# Patient Record
Sex: Female | Born: 1935 | Race: White | Hispanic: No | Marital: Married | Smoking: Never smoker
Health system: Southern US, Community
[De-identification: ages and names within clinical notes are randomized; demographics above are authoritative.]

## PROBLEM LIST (undated history)

## (undated) ENCOUNTER — Emergency Department (HOSPITAL_COMMUNITY): Admission: EM | Discharge status: Absent without leave | Payer: Medicare Other

## (undated) DIAGNOSIS — H3500 Unspecified background retinopathy: Secondary | ICD-10-CM

## (undated) DIAGNOSIS — I2510 Atherosclerotic heart disease of native coronary artery without angina pectoris: Secondary | ICD-10-CM

## (undated) DIAGNOSIS — K900 Celiac disease: Secondary | ICD-10-CM

## (undated) DIAGNOSIS — E785 Hyperlipidemia, unspecified: Secondary | ICD-10-CM

## (undated) DIAGNOSIS — F0390 Unspecified dementia without behavioral disturbance: Secondary | ICD-10-CM

## (undated) DIAGNOSIS — E079 Disorder of thyroid, unspecified: Secondary | ICD-10-CM

## (undated) DIAGNOSIS — M109 Gout, unspecified: Secondary | ICD-10-CM

## (undated) DIAGNOSIS — M1990 Unspecified osteoarthritis, unspecified site: Secondary | ICD-10-CM

## (undated) DIAGNOSIS — G629 Polyneuropathy, unspecified: Secondary | ICD-10-CM

## (undated) DIAGNOSIS — I10 Essential (primary) hypertension: Secondary | ICD-10-CM

---

## 1997-05-28 ENCOUNTER — Encounter (HOSPITAL_COMMUNITY): Admission: RE | Admit: 1997-05-28 | Discharge: 1997-08-26 | Payer: Self-pay | Admitting: *Deleted

## 1997-11-28 ENCOUNTER — Ambulatory Visit (HOSPITAL_COMMUNITY): Admission: RE | Admit: 1997-11-28 | Discharge: 1997-11-29 | Payer: Self-pay | Admitting: Cardiology

## 1998-01-10 ENCOUNTER — Ambulatory Visit (HOSPITAL_COMMUNITY): Admission: RE | Admit: 1998-01-10 | Discharge: 1998-01-11 | Payer: Self-pay | Admitting: Surgery

## 1999-01-09 ENCOUNTER — Encounter: Admission: RE | Admit: 1999-01-09 | Discharge: 1999-01-09 | Payer: Self-pay | Admitting: Family Medicine

## 1999-01-09 ENCOUNTER — Encounter: Payer: Self-pay | Admitting: Family Medicine

## 2000-05-18 ENCOUNTER — Other Ambulatory Visit: Admission: RE | Admit: 2000-05-18 | Discharge: 2000-05-18 | Payer: Self-pay | Admitting: Endocrinology

## 2000-08-09 ENCOUNTER — Other Ambulatory Visit: Admission: RE | Admit: 2000-08-09 | Discharge: 2000-08-09 | Payer: Self-pay | Admitting: Obstetrics & Gynecology

## 2000-08-10 ENCOUNTER — Encounter: Payer: Self-pay | Admitting: Student

## 2000-08-10 ENCOUNTER — Encounter: Admission: RE | Admit: 2000-08-10 | Discharge: 2000-08-10 | Payer: Self-pay | Admitting: Student

## 2001-09-07 ENCOUNTER — Encounter: Payer: Self-pay | Admitting: Orthopedic Surgery

## 2001-09-11 ENCOUNTER — Inpatient Hospital Stay (HOSPITAL_COMMUNITY): Admission: RE | Admit: 2001-09-11 | Discharge: 2001-09-18 | Payer: Self-pay | Admitting: Orthopedic Surgery

## 2001-09-13 ENCOUNTER — Encounter: Payer: Self-pay | Admitting: Orthopedic Surgery

## 2001-09-14 ENCOUNTER — Encounter: Payer: Self-pay | Admitting: Cardiology

## 2001-09-15 ENCOUNTER — Encounter: Payer: Self-pay | Admitting: Orthopedic Surgery

## 2001-09-15 ENCOUNTER — Encounter: Payer: Self-pay | Admitting: Interventional Cardiology

## 2001-09-18 ENCOUNTER — Inpatient Hospital Stay (HOSPITAL_COMMUNITY)
Admission: RE | Admit: 2001-09-18 | Discharge: 2001-09-26 | Payer: Self-pay | Admitting: Physical Medicine & Rehabilitation

## 2001-09-18 ENCOUNTER — Encounter: Payer: Self-pay | Admitting: Orthopedic Surgery

## 2001-10-24 ENCOUNTER — Encounter: Admission: RE | Admit: 2001-10-24 | Discharge: 2001-11-24 | Payer: Self-pay | Admitting: Orthopedic Surgery

## 2001-11-16 ENCOUNTER — Ambulatory Visit (HOSPITAL_COMMUNITY): Admission: RE | Admit: 2001-11-16 | Discharge: 2001-11-16 | Payer: Self-pay | Admitting: Orthopedic Surgery

## 2002-10-10 ENCOUNTER — Other Ambulatory Visit: Admission: RE | Admit: 2002-10-10 | Discharge: 2002-10-10 | Payer: Self-pay | Admitting: Obstetrics & Gynecology

## 2004-01-09 ENCOUNTER — Emergency Department (HOSPITAL_COMMUNITY): Admission: EM | Admit: 2004-01-09 | Discharge: 2004-01-09 | Payer: Self-pay | Admitting: Emergency Medicine

## 2004-01-23 ENCOUNTER — Encounter: Admission: RE | Admit: 2004-01-23 | Discharge: 2004-01-23 | Payer: Self-pay | Admitting: Orthopedic Surgery

## 2005-03-19 ENCOUNTER — Other Ambulatory Visit: Admission: RE | Admit: 2005-03-19 | Discharge: 2005-03-19 | Payer: Self-pay | Admitting: Obstetrics & Gynecology

## 2005-07-08 ENCOUNTER — Emergency Department (HOSPITAL_COMMUNITY): Admission: EM | Admit: 2005-07-08 | Discharge: 2005-07-09 | Payer: Self-pay | Admitting: Emergency Medicine

## 2005-07-14 ENCOUNTER — Ambulatory Visit (HOSPITAL_COMMUNITY): Admission: RE | Admit: 2005-07-14 | Discharge: 2005-07-14 | Payer: Self-pay | Admitting: Family Medicine

## 2005-08-06 ENCOUNTER — Inpatient Hospital Stay (HOSPITAL_COMMUNITY): Admission: EM | Admit: 2005-08-06 | Discharge: 2005-08-13 | Payer: Self-pay | Admitting: Internal Medicine

## 2005-08-13 ENCOUNTER — Inpatient Hospital Stay (HOSPITAL_COMMUNITY)
Admission: RE | Admit: 2005-08-13 | Discharge: 2005-08-21 | Payer: Self-pay | Admitting: Physical Medicine & Rehabilitation

## 2005-08-13 ENCOUNTER — Ambulatory Visit: Payer: Self-pay | Admitting: Physical Medicine & Rehabilitation

## 2005-08-30 ENCOUNTER — Emergency Department (HOSPITAL_COMMUNITY): Admission: EM | Admit: 2005-08-30 | Discharge: 2005-08-30 | Payer: Self-pay | Admitting: Family Medicine

## 2005-08-31 ENCOUNTER — Emergency Department (HOSPITAL_COMMUNITY): Admission: EM | Admit: 2005-08-31 | Discharge: 2005-08-31 | Payer: Self-pay | Admitting: Emergency Medicine

## 2005-12-09 ENCOUNTER — Ambulatory Visit (HOSPITAL_COMMUNITY): Admission: RE | Admit: 2005-12-09 | Discharge: 2005-12-10 | Payer: Self-pay | Admitting: Ophthalmology

## 2006-07-04 ENCOUNTER — Encounter: Admission: RE | Admit: 2006-07-04 | Discharge: 2006-07-04 | Payer: Self-pay | Admitting: Orthopaedic Surgery

## 2006-07-12 IMAGING — CR DG HIP COMPLETE 2+V*R*
3 series · 3 of 3 positions shown · non-contrast
Comparison: No prior studies.

CLINICAL DATA: Fall, right hip pain.
 RIGHT HIP ? 3 VIEW ? 08/31/05:

[t pelvis a.p.]
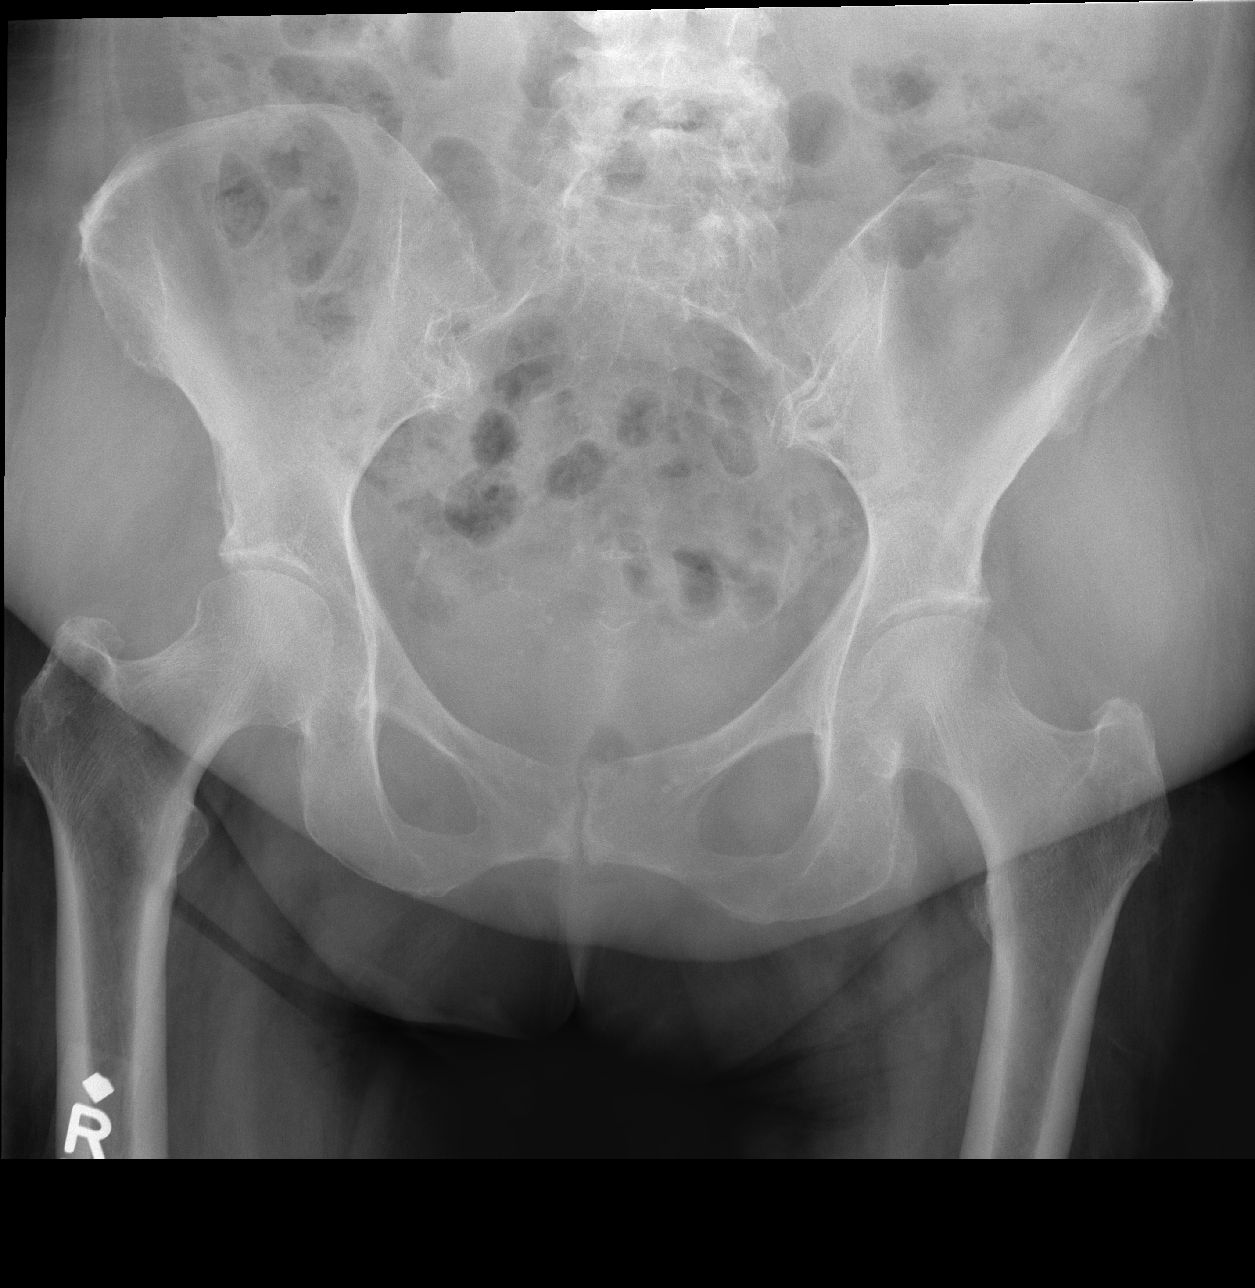

[t hip ap right]
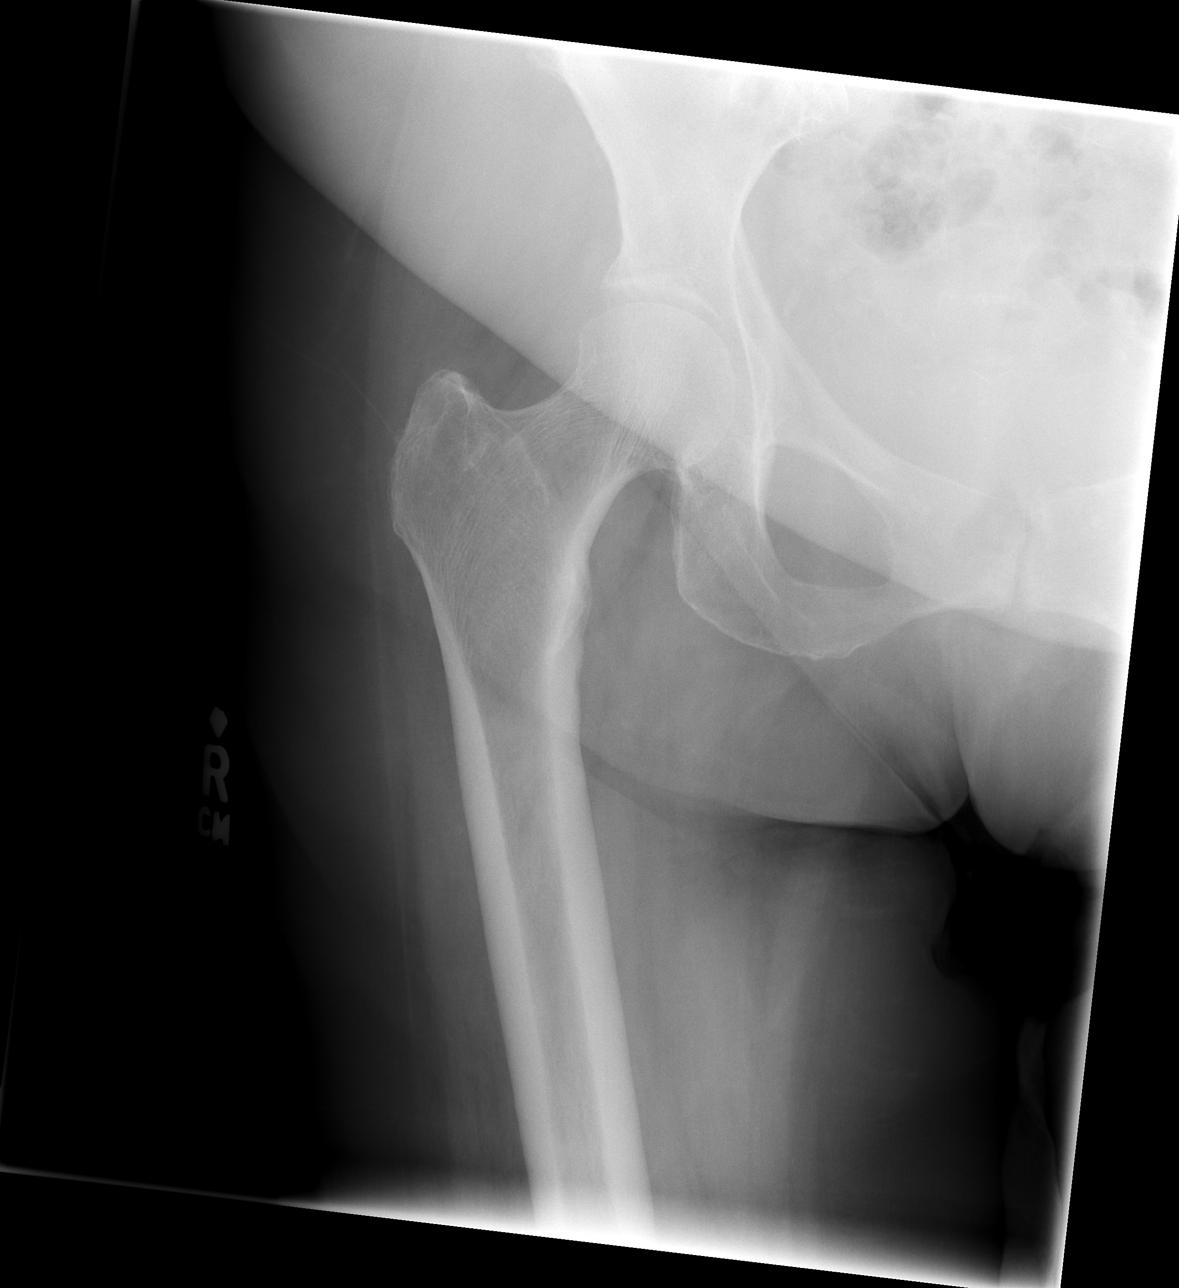

[t hip frog leg right]
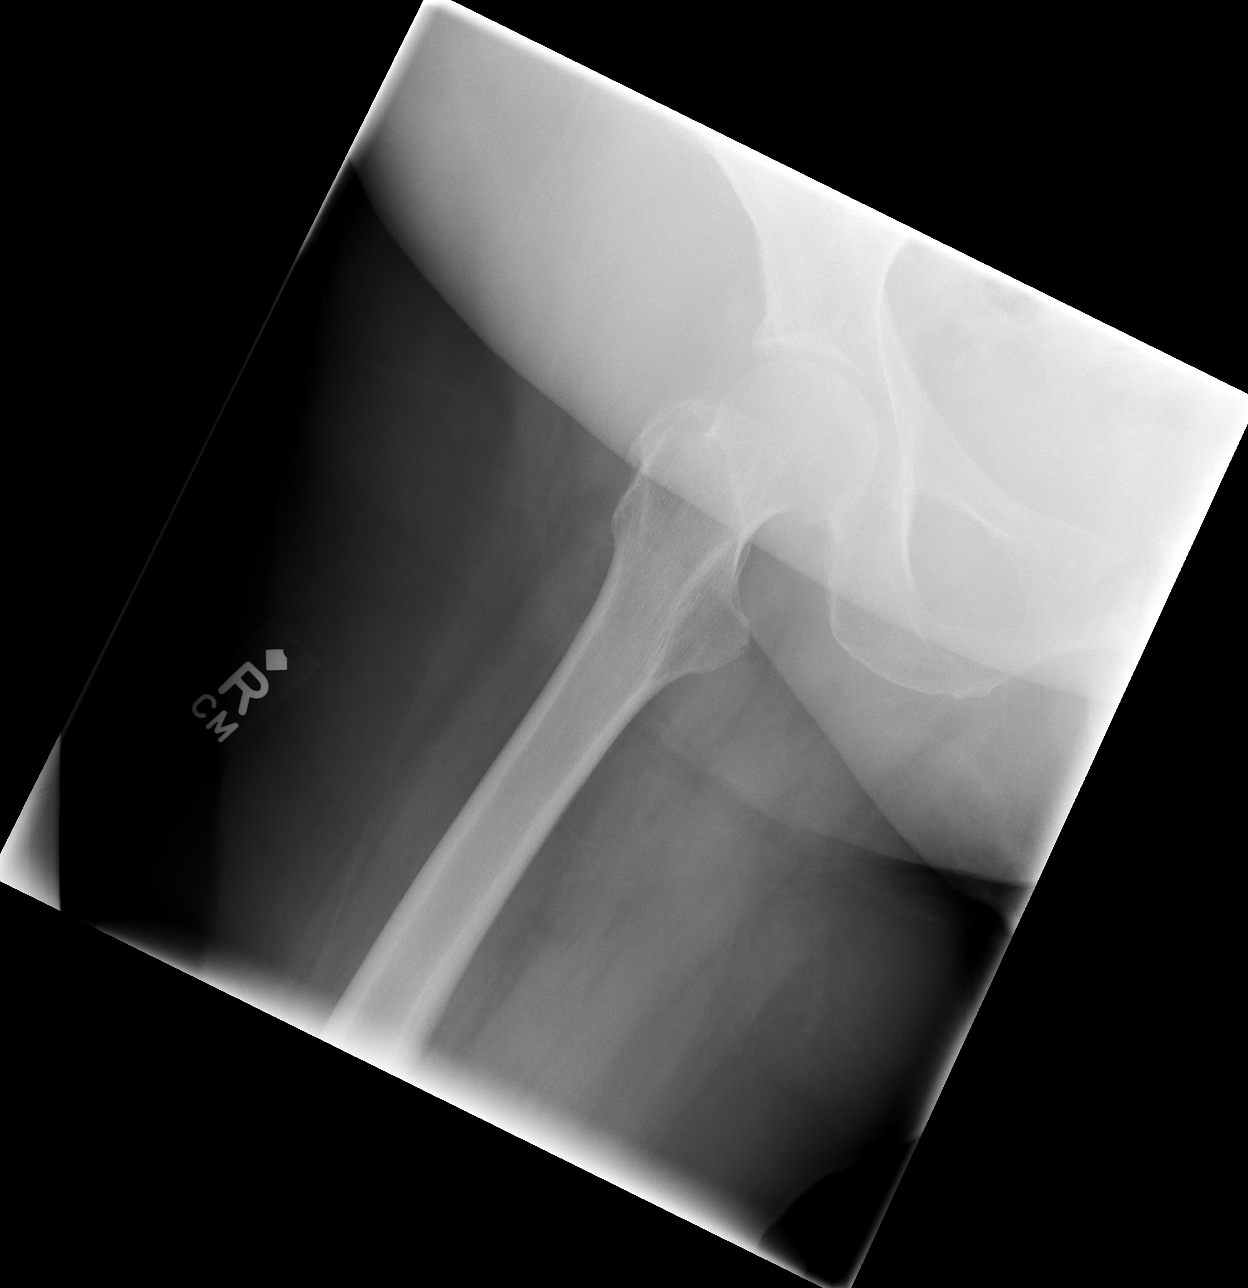

[3 of 3 positions shown; findings below may reference images not displayed]

FINDINGS: Lower lumbar spondylosis is present.  No fracture is radiographically apparent.  No dislocation noted.  The sacrum is obscured by bony demineralization and superimposed bowel gas.
IMPRESSION: 1.  No visible fracture.  If pain persists despite conservative therapy or if clinically warranted, CT could be helpful to exclude initially occult fracture. 
 2.  Lower lumbar spondylosis.

## 2006-07-12 IMAGING — CT CT L SPINE W/O CM
4 series · 17 of 36 positions shown, 19 images · IV contrast (agent unspecified)
Comparison: Comparison to MR performed on 07/14/05.

CLINICAL DATA: 75-year-old female with recent fall with back pain. 
LUMBAR SPINE CT WITHOUT CONTRAST:
TECHNIQUE: Multidetector CT imaging of the lumbar spine was performed.  Multiplanar CT image reconstructions were also generated.

[Series 2: l-spine helical · axial · 0.27mm/px · z∈[-304,-164]mm · 5 of 86 slices shown, 7 images]
[im 15/86  soft-tissue]
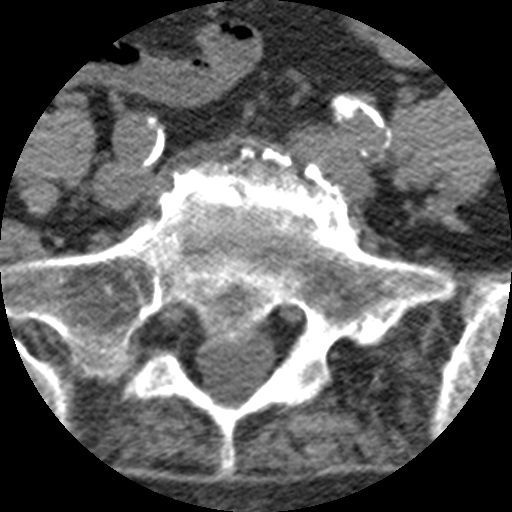
[im 15/86  bone]
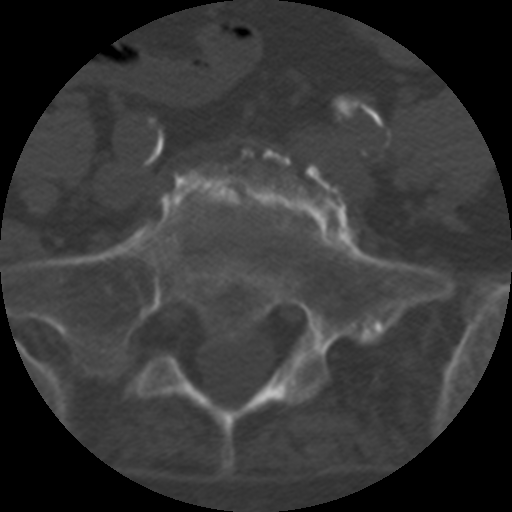
[im 29/86  bone]
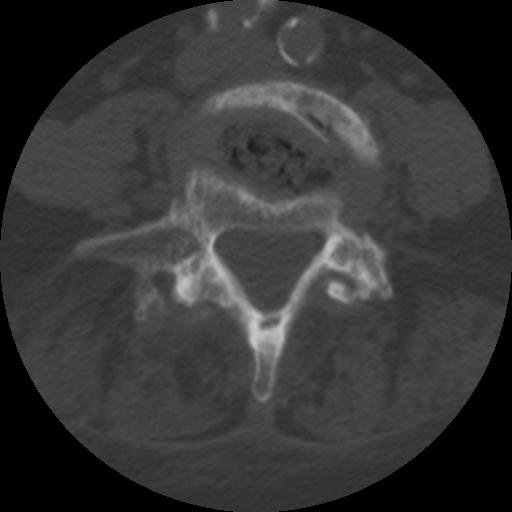
[im 43/86  bone]
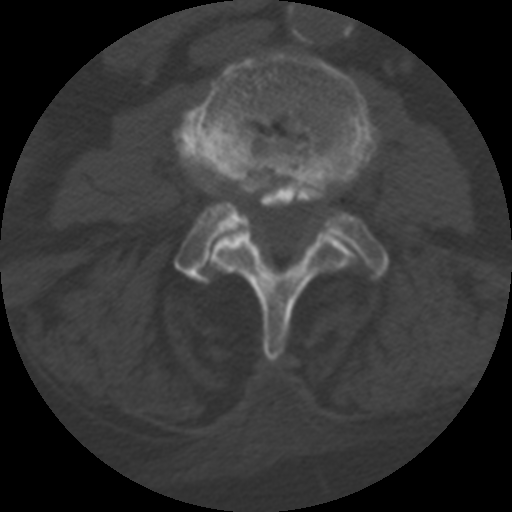
[im 57/86  bone]
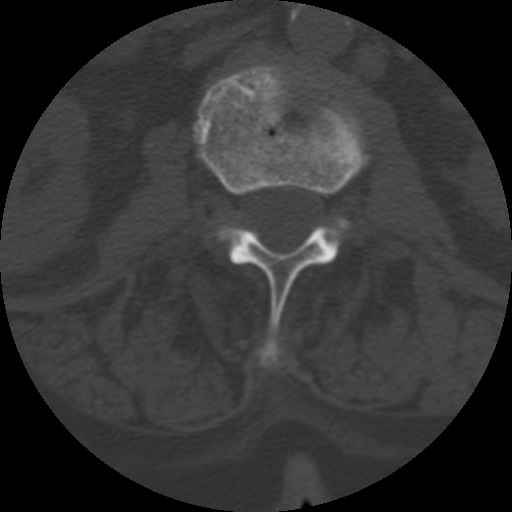
[im 71/86  soft-tissue]
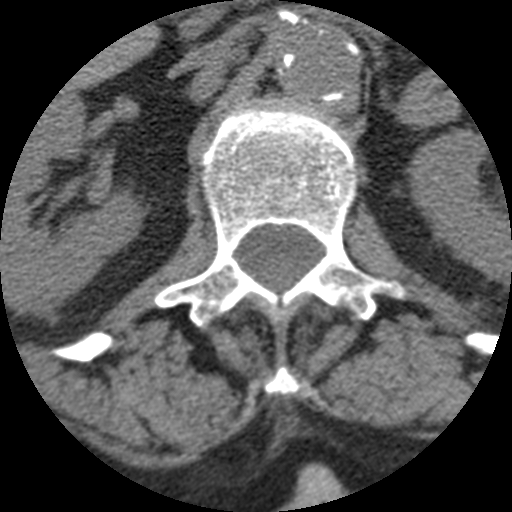
[im 71/86  bone]
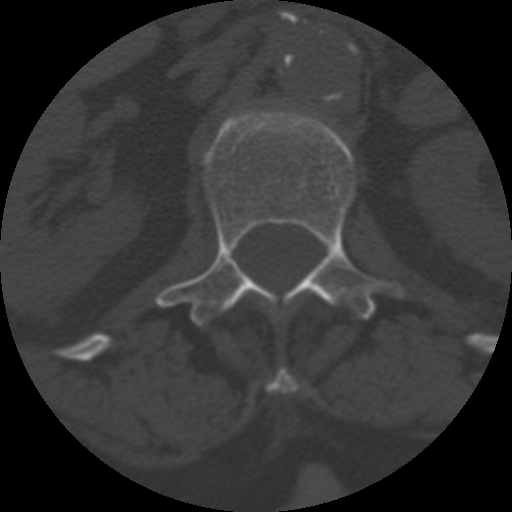

[Series 3: recon 2: l-spine helical · axial · 0.27mm/px · z∈[-304,-234]mm · 3 of 86 slices shown]
[im 15/86  bone]
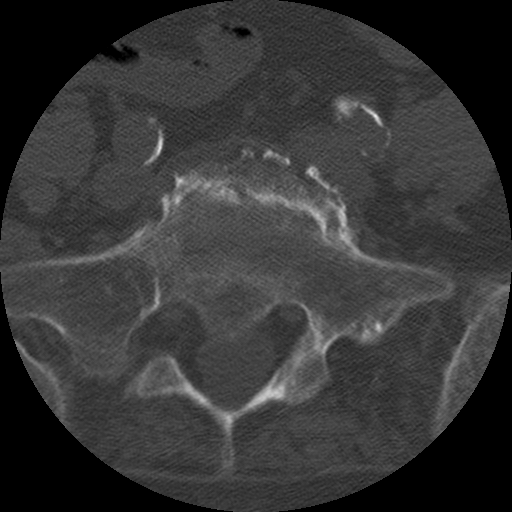
[im 29/86  bone]
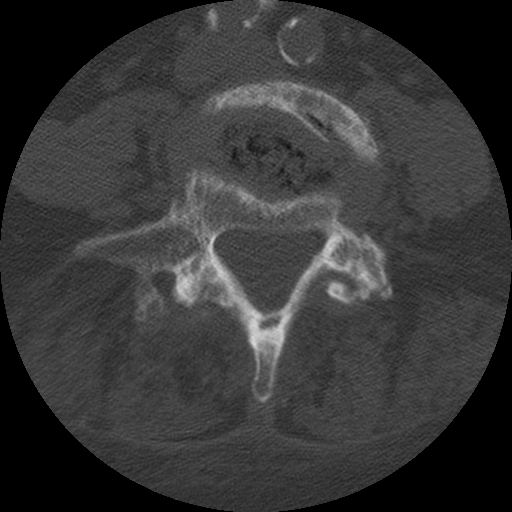
[im 43/86  bone]
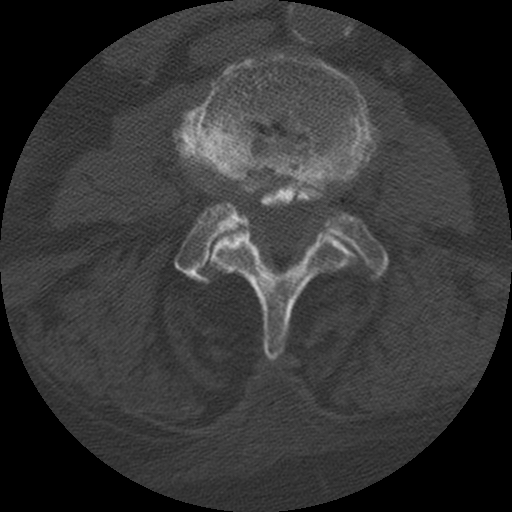

[Series 400: reformatted · sagittal · 0.43mm/px · 3 of 48 slices shown (1 of 2)]
[im 10/48  bone]
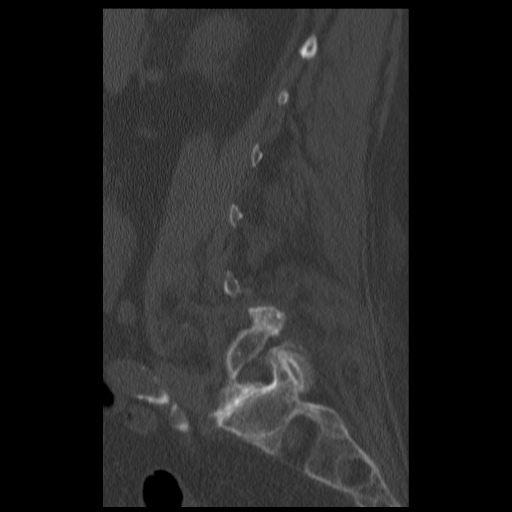
[im 19/48  bone]
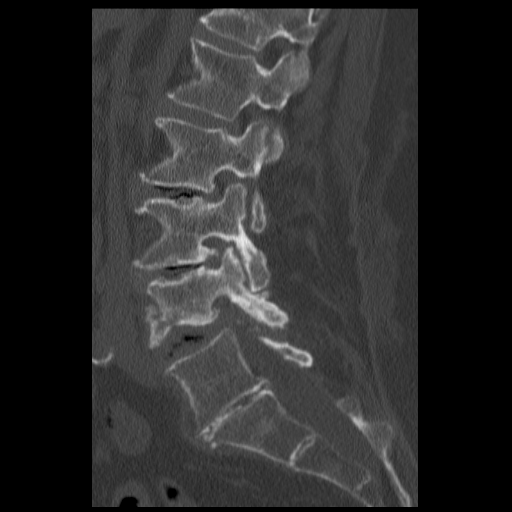
[im 29/48  bone]
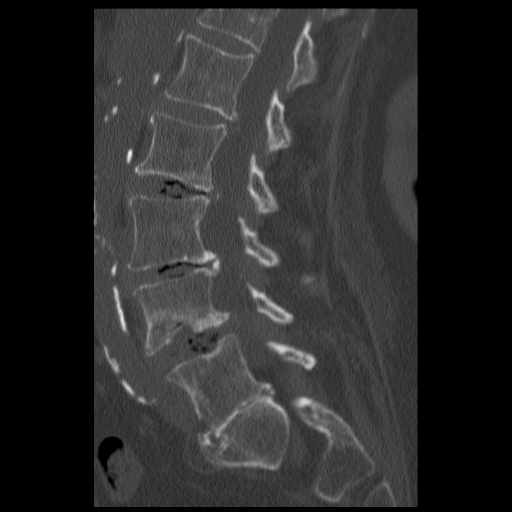

[Series 401: reformatted · coronal · 0.43mm/px · 6 of 55 slices shown (2 of 2)]
[im 19/55  bone]
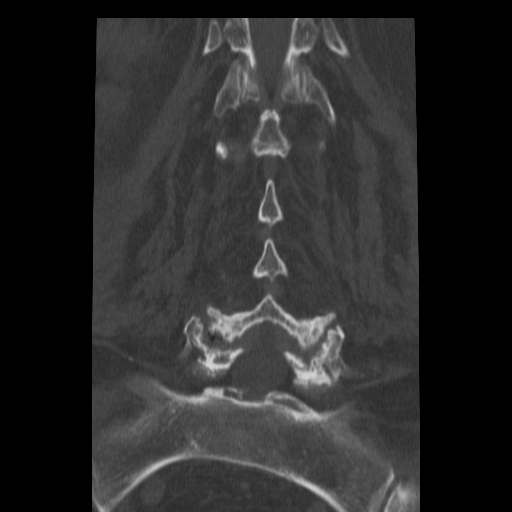
[im 23/55  bone]
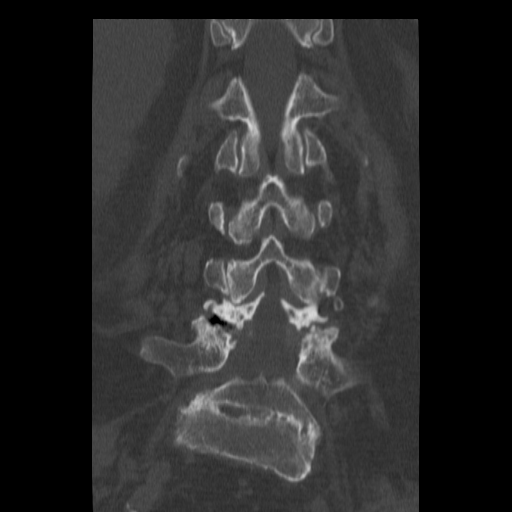
[im 27/55  soft-tissue]
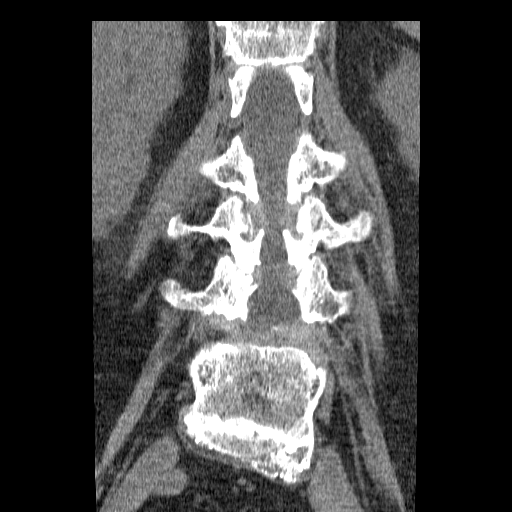
[im 28/55  bone]
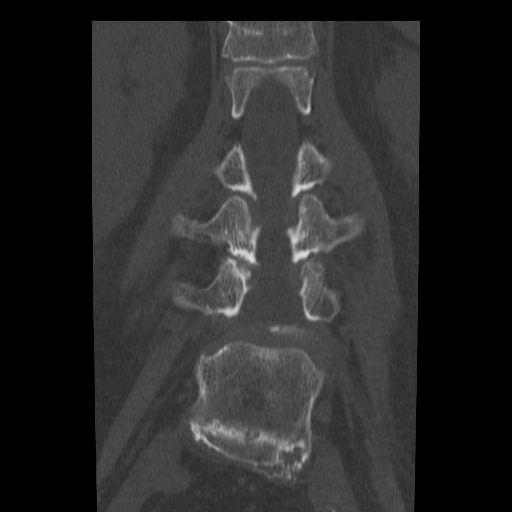
[im 32/55  bone]
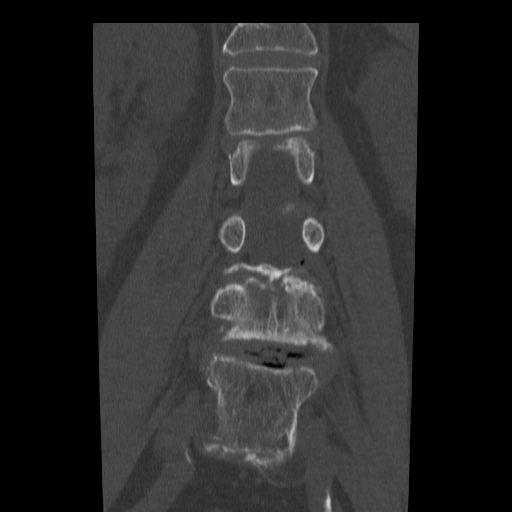
[im 37/55  bone]
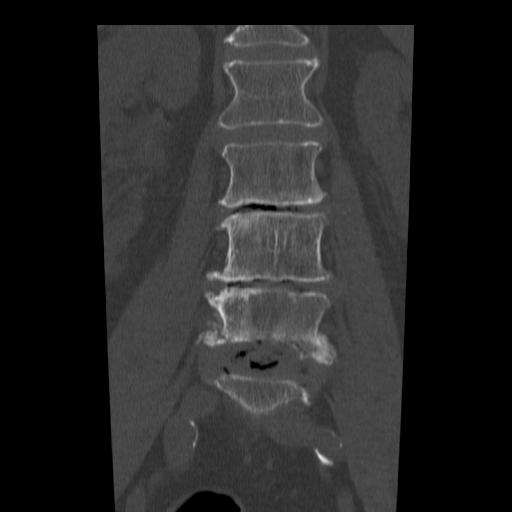

[17 of 36 positions shown; findings below may reference images not displayed]

FINDINGS: Same numbering system will be utilized as on the prior MRI. 
A compression fracture of the inferior endplate of L4 is unchanged.  Lumbar alignment is unremarkable.  Multilevel degenerative disc disease, spondylosis, and facet arthropathy are unchanged.  There is no evidence of acute fracture, spondylolisthesis, or dislocation identified.  
L1-2:  Diffuse disc bulge slightly eccentric in the left paracentral region indents the thecal sac.
L2-3:  Diffuse disc bulge, facet arthropathy, and ligamentum flavum hypertrophy contribute to mild biforaminal narrowing, unchanged. 
L3-4:  Diffuse disc protrusion with associated osteophytes eccentric in the right paracentral foraminal region contributes to moderate to severe right foraminal narrowing and mild to moderate central spinal and left foraminal narrowing. 
L4-5:  Diffuse disc bulge, advanced facet arthropathy, and ligamentum flavum hypertrophy contribute to moderate biforaminal, lateral recess, and neuroforaminal narrowing.
L5-S1:  Stable.
IMPRESSION: 1.  No acute abnormality.  
2.  Stable compression fracture of the inferior endplate of L4. 
3.  Stable multilevel degenerative disc disease, spondylosis, and facet arthropathy contributing to central spinal, neuroforaminal, and lateral recess narrowing as described.

## 2006-07-27 ENCOUNTER — Encounter: Admission: RE | Admit: 2006-07-27 | Discharge: 2006-07-27 | Payer: Self-pay | Admitting: Orthopaedic Surgery

## 2006-09-15 ENCOUNTER — Encounter
Admission: RE | Admit: 2006-09-15 | Discharge: 2006-12-14 | Payer: Self-pay | Admitting: Physical Medicine & Rehabilitation

## 2006-09-16 ENCOUNTER — Ambulatory Visit: Payer: Self-pay | Admitting: Physical Medicine & Rehabilitation

## 2006-11-14 ENCOUNTER — Ambulatory Visit: Payer: Self-pay | Admitting: Physical Medicine & Rehabilitation

## 2006-12-28 ENCOUNTER — Encounter
Admission: RE | Admit: 2006-12-28 | Discharge: 2007-01-28 | Payer: Self-pay | Admitting: Physical Medicine & Rehabilitation

## 2006-12-28 ENCOUNTER — Ambulatory Visit: Payer: Self-pay | Admitting: Physical Medicine & Rehabilitation

## 2007-01-06 ENCOUNTER — Encounter: Admission: RE | Admit: 2007-01-06 | Discharge: 2007-01-06 | Payer: Self-pay | Admitting: Internal Medicine

## 2007-03-08 ENCOUNTER — Ambulatory Visit: Payer: Self-pay | Admitting: Physical Medicine & Rehabilitation

## 2007-03-08 ENCOUNTER — Encounter
Admission: RE | Admit: 2007-03-08 | Discharge: 2007-06-06 | Payer: Self-pay | Admitting: Physical Medicine & Rehabilitation

## 2007-04-13 ENCOUNTER — Ambulatory Visit: Payer: Self-pay | Admitting: Physical Medicine & Rehabilitation

## 2007-05-17 ENCOUNTER — Ambulatory Visit: Payer: Self-pay | Admitting: Physical Medicine & Rehabilitation

## 2007-07-07 ENCOUNTER — Encounter
Admission: RE | Admit: 2007-07-07 | Discharge: 2007-10-05 | Payer: Self-pay | Admitting: Physical Medicine & Rehabilitation

## 2007-07-11 ENCOUNTER — Ambulatory Visit: Payer: Self-pay | Admitting: Physical Medicine & Rehabilitation

## 2007-08-10 ENCOUNTER — Ambulatory Visit: Payer: Self-pay | Admitting: Physical Medicine & Rehabilitation

## 2007-09-07 ENCOUNTER — Ambulatory Visit: Payer: Self-pay | Admitting: Physical Medicine & Rehabilitation

## 2007-10-05 ENCOUNTER — Encounter
Admission: RE | Admit: 2007-10-05 | Discharge: 2008-01-03 | Payer: Self-pay | Admitting: Physical Medicine & Rehabilitation

## 2007-10-09 ENCOUNTER — Ambulatory Visit: Payer: Self-pay | Admitting: Physical Medicine & Rehabilitation

## 2007-10-23 ENCOUNTER — Ambulatory Visit: Payer: Self-pay | Admitting: Physical Medicine & Rehabilitation

## 2007-11-17 ENCOUNTER — Encounter
Admission: RE | Admit: 2007-11-17 | Discharge: 2007-11-17 | Payer: Self-pay | Admitting: Physical Medicine & Rehabilitation

## 2007-11-20 ENCOUNTER — Ambulatory Visit: Payer: Self-pay | Admitting: Physical Medicine & Rehabilitation

## 2007-12-19 ENCOUNTER — Ambulatory Visit: Payer: Self-pay | Admitting: Physical Medicine & Rehabilitation

## 2008-01-15 ENCOUNTER — Encounter
Admission: RE | Admit: 2008-01-15 | Discharge: 2008-04-14 | Payer: Self-pay | Admitting: Physical Medicine & Rehabilitation

## 2008-01-16 ENCOUNTER — Ambulatory Visit: Payer: Self-pay | Admitting: Physical Medicine & Rehabilitation

## 2008-02-20 ENCOUNTER — Ambulatory Visit: Payer: Self-pay | Admitting: Physical Medicine & Rehabilitation

## 2008-03-21 ENCOUNTER — Ambulatory Visit: Payer: Self-pay | Admitting: Physical Medicine & Rehabilitation

## 2008-04-11 ENCOUNTER — Encounter
Admission: RE | Admit: 2008-04-11 | Discharge: 2008-07-10 | Payer: Self-pay | Admitting: Physical Medicine & Rehabilitation

## 2008-04-15 ENCOUNTER — Ambulatory Visit: Payer: Self-pay | Admitting: Physical Medicine & Rehabilitation

## 2008-05-16 ENCOUNTER — Ambulatory Visit: Payer: Self-pay | Admitting: Physical Medicine & Rehabilitation

## 2008-06-17 ENCOUNTER — Ambulatory Visit: Payer: Self-pay | Admitting: Physical Medicine & Rehabilitation

## 2008-07-12 ENCOUNTER — Encounter
Admission: RE | Admit: 2008-07-12 | Discharge: 2008-10-10 | Payer: Self-pay | Admitting: Physical Medicine & Rehabilitation

## 2008-07-16 ENCOUNTER — Ambulatory Visit: Payer: Self-pay | Admitting: Physical Medicine & Rehabilitation

## 2008-08-22 ENCOUNTER — Ambulatory Visit: Payer: Self-pay | Admitting: Physical Medicine & Rehabilitation

## 2008-08-28 ENCOUNTER — Encounter: Admission: RE | Admit: 2008-08-28 | Discharge: 2008-08-28 | Payer: Self-pay | Admitting: Obstetrics & Gynecology

## 2008-11-05 ENCOUNTER — Encounter
Admission: RE | Admit: 2008-11-05 | Discharge: 2008-11-07 | Payer: Self-pay | Admitting: Physical Medicine & Rehabilitation

## 2008-11-07 ENCOUNTER — Ambulatory Visit: Payer: Self-pay | Admitting: Physical Medicine & Rehabilitation

## 2009-02-24 ENCOUNTER — Encounter
Admission: RE | Admit: 2009-02-24 | Discharge: 2009-05-25 | Payer: Self-pay | Admitting: Physical Medicine & Rehabilitation

## 2009-02-24 ENCOUNTER — Ambulatory Visit: Payer: Self-pay | Admitting: Physical Medicine & Rehabilitation

## 2009-03-24 ENCOUNTER — Ambulatory Visit: Payer: Self-pay | Admitting: Physical Medicine & Rehabilitation

## 2009-04-22 ENCOUNTER — Ambulatory Visit: Payer: Self-pay | Admitting: Physical Medicine & Rehabilitation

## 2009-07-03 ENCOUNTER — Ambulatory Visit: Payer: Self-pay | Admitting: Physical Medicine & Rehabilitation

## 2009-07-03 ENCOUNTER — Encounter
Admission: RE | Admit: 2009-07-03 | Discharge: 2009-10-01 | Payer: Self-pay | Admitting: Physical Medicine & Rehabilitation

## 2009-07-25 ENCOUNTER — Ambulatory Visit: Payer: Self-pay | Admitting: Physical Medicine & Rehabilitation

## 2009-08-27 ENCOUNTER — Ambulatory Visit: Payer: Self-pay | Admitting: Physical Medicine & Rehabilitation

## 2009-09-17 ENCOUNTER — Encounter
Admission: RE | Admit: 2009-09-17 | Discharge: 2009-09-17 | Payer: Self-pay | Admitting: Physical Medicine & Rehabilitation

## 2009-09-24 ENCOUNTER — Ambulatory Visit: Payer: Self-pay | Admitting: Physical Medicine & Rehabilitation

## 2009-10-24 ENCOUNTER — Encounter
Admission: RE | Admit: 2009-10-24 | Discharge: 2010-01-22 | Payer: Self-pay | Source: Home / Self Care | Admitting: Physical Medicine & Rehabilitation

## 2009-10-30 ENCOUNTER — Ambulatory Visit: Payer: Self-pay | Admitting: Physical Medicine & Rehabilitation

## 2009-12-03 ENCOUNTER — Ambulatory Visit: Payer: Self-pay | Admitting: Physical Medicine & Rehabilitation

## 2010-01-09 ENCOUNTER — Ambulatory Visit: Payer: Self-pay | Admitting: Physical Medicine & Rehabilitation

## 2010-01-30 ENCOUNTER — Encounter
Admission: RE | Admit: 2010-01-30 | Discharge: 2010-02-04 | Payer: Self-pay | Source: Home / Self Care | Attending: Physical Medicine & Rehabilitation | Admitting: Physical Medicine & Rehabilitation

## 2010-02-04 ENCOUNTER — Ambulatory Visit: Payer: Self-pay | Admitting: Physical Medicine & Rehabilitation

## 2010-02-27 ENCOUNTER — Encounter
Admission: RE | Admit: 2010-02-27 | Discharge: 2010-03-24 | Payer: Self-pay | Source: Home / Self Care | Attending: Physical Medicine & Rehabilitation | Admitting: Physical Medicine & Rehabilitation

## 2010-03-04 ENCOUNTER — Ambulatory Visit
Admission: RE | Admit: 2010-03-04 | Discharge: 2010-03-04 | Payer: Self-pay | Source: Home / Self Care | Attending: Physical Medicine & Rehabilitation | Admitting: Physical Medicine & Rehabilitation

## 2010-03-16 ENCOUNTER — Encounter: Payer: Self-pay | Admitting: Obstetrics & Gynecology

## 2010-04-03 ENCOUNTER — Ambulatory Visit: Payer: Self-pay | Admitting: Physical Medicine & Rehabilitation

## 2010-04-14 ENCOUNTER — Ambulatory Visit: Payer: PRIVATE HEALTH INSURANCE | Admitting: Physical Medicine & Rehabilitation

## 2010-04-14 ENCOUNTER — Encounter: Payer: Medicare Other | Attending: Physical Medicine & Rehabilitation

## 2010-04-14 DIAGNOSIS — R5381 Other malaise: Secondary | ICD-10-CM | POA: Insufficient documentation

## 2010-04-14 DIAGNOSIS — M25529 Pain in unspecified elbow: Secondary | ICD-10-CM

## 2010-04-14 DIAGNOSIS — F3289 Other specified depressive episodes: Secondary | ICD-10-CM | POA: Insufficient documentation

## 2010-04-14 DIAGNOSIS — S5290XD Unspecified fracture of unspecified forearm, subsequent encounter for closed fracture with routine healing: Secondary | ICD-10-CM | POA: Insufficient documentation

## 2010-04-14 DIAGNOSIS — F411 Generalized anxiety disorder: Secondary | ICD-10-CM | POA: Insufficient documentation

## 2010-04-14 DIAGNOSIS — G8929 Other chronic pain: Secondary | ICD-10-CM | POA: Insufficient documentation

## 2010-04-14 DIAGNOSIS — R262 Difficulty in walking, not elsewhere classified: Secondary | ICD-10-CM | POA: Insufficient documentation

## 2010-04-14 DIAGNOSIS — M48061 Spinal stenosis, lumbar region without neurogenic claudication: Secondary | ICD-10-CM

## 2010-04-14 DIAGNOSIS — M47817 Spondylosis without myelopathy or radiculopathy, lumbosacral region: Secondary | ICD-10-CM | POA: Insufficient documentation

## 2010-04-14 DIAGNOSIS — F329 Major depressive disorder, single episode, unspecified: Secondary | ICD-10-CM | POA: Insufficient documentation

## 2010-04-14 DIAGNOSIS — M719 Bursopathy, unspecified: Secondary | ICD-10-CM | POA: Insufficient documentation

## 2010-04-14 DIAGNOSIS — M67919 Unspecified disorder of synovium and tendon, unspecified shoulder: Secondary | ICD-10-CM | POA: Insufficient documentation

## 2010-04-14 DIAGNOSIS — R42 Dizziness and giddiness: Secondary | ICD-10-CM | POA: Insufficient documentation

## 2010-05-15 NOTE — Assessment & Plan Note (Signed)
This is a 77 year old female with chief complaint of shoulder pain as well as wrist pain and secondary complaint of her chronic back pain.  INTERVAL HISTORY:  Since I last saw the patient on January 09, 2010, she has had a fall.  She tripped over husband's oxygen tubing and sustained a right distal radial fracture which is in a splint but is to be casted tomorrow.  She has had some increased right shoulder pain, is in a sling at the current time.  Her back pain is about the same.  Her average pain is 7-8/10, intermittent, aching, sharp, interfering with activity.  She now needs home health aid, her husband helps her as well with her dressing and bathing and toileting, meal prep.  She hurt her knee but had negative x-rays.  REVIEW OF SYSTEMS:  Weakness, trouble walking, dizziness, depression, anxiety.  CURRENT PAIN MEDICATIONS:  Oxycodone 7.5/325, she gets 100 tablets per month, to take three times a day or four times a day, more recently has been up to four times a day.  PHYSICAL EXAMINATION:  VITAL SIGNS:  Blood pressure 157/43, pulse 61, respirations 18, O2 sat 98% on room air. GENERAL:  In no acute distress. HEENT:  She does have some bruising around her right eye which is fading.  CT of the skull, I reviewed, no evidence of fracture. EXTREMITIES:  She has no numbness in her right hand.  She has pain with shoulder abduction.  I reviewed her x-ray of her right wrist which did show the distal radial fracture.  IMPRESSION: 1. Lumbar spondylosis and stenosis, this is her underlying pain     complaint. 2. Acute right radius fracture, being treated at [facility], Dr. Farris Has. 3. Right shoulder pain, subacromial bursitis, question whether she has     a tear related to her fall.  In any case, I do not think it is     appropriate to inject at this point.  I will see her back in     another month, possibly inject it at that time.  Continue current     medications  except increase to 120 tablets per month of the     oxycodone.     Erick Colace, M.D. Electronically Signed    AEK/MedQ D:  04/14/2010 17:23:03  T:  04/14/2010 21:49:13  Job #:  [id number]  cc:   Estell Harpin, M.D. Fax: [phone]

## 2010-05-17 ENCOUNTER — Emergency Department (HOSPITAL_COMMUNITY): Payer: Medicare Other

## 2010-05-17 ENCOUNTER — Observation Stay (HOSPITAL_COMMUNITY)
Admission: EM | Admit: 2010-05-17 | Discharge: 2010-05-20 | Disposition: A | Discharge status: Skilled Nursing Facility | Payer: Medicare Other | Attending: Internal Medicine | Admitting: Internal Medicine

## 2010-05-17 DIAGNOSIS — N184 Chronic kidney disease, stage 4 (severe): Secondary | ICD-10-CM | POA: Insufficient documentation

## 2010-05-17 DIAGNOSIS — E1149 Type 2 diabetes mellitus with other diabetic neurological complication: Secondary | ICD-10-CM | POA: Insufficient documentation

## 2010-05-17 DIAGNOSIS — M549 Dorsalgia, unspecified: Secondary | ICD-10-CM | POA: Insufficient documentation

## 2010-05-17 DIAGNOSIS — E1129 Type 2 diabetes mellitus with other diabetic kidney complication: Secondary | ICD-10-CM | POA: Insufficient documentation

## 2010-05-17 DIAGNOSIS — M109 Gout, unspecified: Secondary | ICD-10-CM | POA: Insufficient documentation

## 2010-05-17 DIAGNOSIS — S62109A Fracture of unspecified carpal bone, unspecified wrist, initial encounter for closed fracture: Principal | ICD-10-CM | POA: Insufficient documentation

## 2010-05-17 DIAGNOSIS — S52509A Unspecified fracture of the lower end of unspecified radius, initial encounter for closed fracture: Secondary | ICD-10-CM | POA: Insufficient documentation

## 2010-05-17 DIAGNOSIS — E785 Hyperlipidemia, unspecified: Secondary | ICD-10-CM | POA: Insufficient documentation

## 2010-05-17 DIAGNOSIS — G8929 Other chronic pain: Secondary | ICD-10-CM | POA: Insufficient documentation

## 2010-05-17 DIAGNOSIS — A498 Other bacterial infections of unspecified site: Secondary | ICD-10-CM | POA: Insufficient documentation

## 2010-05-17 DIAGNOSIS — E039 Hypothyroidism, unspecified: Secondary | ICD-10-CM | POA: Insufficient documentation

## 2010-05-17 DIAGNOSIS — I10 Essential (primary) hypertension: Secondary | ICD-10-CM | POA: Insufficient documentation

## 2010-05-17 DIAGNOSIS — Y92009 Unspecified place in unspecified non-institutional (private) residence as the place of occurrence of the external cause: Secondary | ICD-10-CM | POA: Insufficient documentation

## 2010-05-17 DIAGNOSIS — Z79899 Other long term (current) drug therapy: Secondary | ICD-10-CM | POA: Insufficient documentation

## 2010-05-17 DIAGNOSIS — W010XXA Fall on same level from slipping, tripping and stumbling without subsequent striking against object, initial encounter: Secondary | ICD-10-CM | POA: Insufficient documentation

## 2010-05-17 DIAGNOSIS — M810 Age-related osteoporosis without current pathological fracture: Secondary | ICD-10-CM | POA: Insufficient documentation

## 2010-05-17 DIAGNOSIS — N390 Urinary tract infection, site not specified: Secondary | ICD-10-CM | POA: Insufficient documentation

## 2010-05-17 DIAGNOSIS — I129 Hypertensive chronic kidney disease with stage 1 through stage 4 chronic kidney disease, or unspecified chronic kidney disease: Secondary | ICD-10-CM | POA: Insufficient documentation

## 2010-05-17 DIAGNOSIS — R627 Adult failure to thrive: Secondary | ICD-10-CM | POA: Insufficient documentation

## 2010-05-17 DIAGNOSIS — E1139 Type 2 diabetes mellitus with other diabetic ophthalmic complication: Secondary | ICD-10-CM | POA: Insufficient documentation

## 2010-05-17 DIAGNOSIS — G4733 Obstructive sleep apnea (adult) (pediatric): Secondary | ICD-10-CM | POA: Insufficient documentation

## 2010-05-17 DIAGNOSIS — F329 Major depressive disorder, single episode, unspecified: Secondary | ICD-10-CM | POA: Insufficient documentation

## 2010-05-17 DIAGNOSIS — F3289 Other specified depressive episodes: Secondary | ICD-10-CM | POA: Insufficient documentation

## 2010-05-17 DIAGNOSIS — S52609A Unspecified fracture of lower end of unspecified ulna, initial encounter for closed fracture: Secondary | ICD-10-CM | POA: Insufficient documentation

## 2010-05-17 LAB — URINALYSIS, ROUTINE W REFLEX MICROSCOPIC
Bilirubin Urine: NEGATIVE
Hgb urine dipstick: NEGATIVE
Ketones, ur: NEGATIVE mg/dL
Nitrite: POSITIVE — AB
pH: 7 (ref 5.0–8.0)

## 2010-05-17 LAB — URINE MICROSCOPIC-ADD ON

## 2010-05-17 LAB — GLUCOSE, CAPILLARY: Glucose-Capillary: 189 mg/dL — ABNORMAL HIGH (ref 70–99)

## 2010-05-18 LAB — CBC
HCT: 28.3 % — ABNORMAL LOW (ref 36.0–46.0)
Platelets: 206 10*3/uL (ref 150–400)
RDW: 14.5 % (ref 11.5–15.5)
WBC: 5.1 10*3/uL (ref 4.0–10.5)

## 2010-05-18 LAB — COMPREHENSIVE METABOLIC PANEL
ALT: 23 U/L (ref 0–35)
AST: 35 U/L (ref 0–37)
Albumin: 2.9 g/dL — ABNORMAL LOW (ref 3.5–5.2)
Alkaline Phosphatase: 72 U/L (ref 39–117)
Calcium: 8.7 mg/dL (ref 8.4–10.5)
GFR calc Af Amer: 20 mL/min — ABNORMAL LOW (ref >60)
Potassium: 3.5 mEq/L (ref 3.5–5.1)
Sodium: 138 mEq/L (ref 135–145)
Total Protein: 5.7 g/dL — ABNORMAL LOW (ref 6.0–8.3)

## 2010-05-18 LAB — URINALYSIS, ROUTINE W REFLEX MICROSCOPIC
Bilirubin Urine: NEGATIVE
Specific Gravity, Urine: 1.017 (ref 1.005–1.030)
pH: 5 (ref 5.0–8.0)

## 2010-05-18 LAB — T4: T4, Total: 7.3 ug/dL (ref 5.0–12.5)

## 2010-05-18 LAB — URINE MICROSCOPIC-ADD ON

## 2010-05-18 LAB — GLUCOSE, CAPILLARY
Glucose-Capillary: 140 mg/dL — ABNORMAL HIGH (ref 70–99)
Glucose-Capillary: 154 mg/dL — ABNORMAL HIGH (ref 70–99)

## 2010-05-19 ENCOUNTER — Observation Stay (HOSPITAL_COMMUNITY): Payer: Medicare Other

## 2010-05-19 LAB — URINE CULTURE: Culture  Setup Time: 201203252355

## 2010-05-19 LAB — GLUCOSE, CAPILLARY: Glucose-Capillary: 188 mg/dL — ABNORMAL HIGH (ref 70–99)

## 2010-05-20 LAB — CBC
HCT: 29.2 % — ABNORMAL LOW (ref 36.0–46.0)
Platelets: 198 10*3/uL (ref 150–400)
RDW: 14.5 % (ref 11.5–15.5)
WBC: 8.1 10*3/uL (ref 4.0–10.5)

## 2010-05-20 LAB — GLUCOSE, CAPILLARY
Glucose-Capillary: 173 mg/dL — ABNORMAL HIGH (ref 70–99)
Glucose-Capillary: 174 mg/dL — ABNORMAL HIGH (ref 70–99)

## 2010-05-20 LAB — URINE CULTURE
Colony Count: >=100000
Culture  Setup Time: 201203261345

## 2010-05-20 LAB — BASIC METABOLIC PANEL
GFR calc non Af Amer: 18 mL/min — ABNORMAL LOW (ref >60)
Potassium: 3.7 mEq/L (ref 3.5–5.1)
Sodium: 141 mEq/L (ref 135–145)

## 2010-05-22 ENCOUNTER — Ambulatory Visit: Payer: Medicare Other | Admitting: Physical Medicine & Rehabilitation

## 2010-05-22 ENCOUNTER — Ambulatory Visit: Payer: Medicare Other

## 2010-05-24 NOTE — H&P (Signed)
Carrie [name], Carrie [name] NO.:  [id number]  MEDICAL RECORD NO.:  [id number]           PATIENT TYPE:  O  LOCATION:  5011                         FACILITY:  [facility]  PHYSICIAN:  Geoffry Paradise, M.D.  DATE OF BIRTH:  07-13-1929  DATE OF ADMISSION:  05/17/2010 DATE OF DISCHARGE:                             HISTORY & PHYSICAL   CHIEF COMPLAINT:  Left wrist fracture.  HISTORY OF PRESENT ILLNESS:  Carrie [name] is a 77 year old with an extensive medical history including diabetes mellitus type 2 with renal, neurologic, and ophthalmologic complications, essential hypertension, hypothyroidism, obstructive sleep apnea, remote history of ASCAD, presenting at this time with a left wrist fracture.  Also noteworthy, she must have a fairly extensive pain history as she is on a Lidoderm patch, several oxycodone daily, and has seen Dr. Claudette Laws with Pain Management as a result of which she has had multiple ongoingtrigger point injections, epidural steroids, and radiofrequency ablation/neurotomies.  With this history in mind, she did fall and sustain a right wrist fracture approximately 5 weeks ago, casted by Dr. Delbert Harness.  She does care for her husband with Alzheimer disease and evidently during the evening sustained a fall after tripping while carrying blankets and resultant left wrist fracture.  She was seen in the emergency room and evidently Murphy/Wainer were contacted but this was deemed to be nonsurgical.  Family made a decision late last evening that she was not able to be managed at home based upon her pain issues, lack of functionality and therefore she was admitted for further management.  Upon seeing her this morning, right away, she confronted me, telling me that I made her miserable all night for not treating her pain adequately.  I quickly pointed out that was not called for pain medication but only once and she was provided Dilaudid with no return calls  indicating it was not effective.  Her actual pain issues do not seem to be surrounding the left wrist but more rather her back, her knee, her shoulder, all of which she relates are chronic and most of her frustrations seem to be worries regarding her husband.  She has an intent to go home but again family made it clear in the emergency room she was not manageable at home.  She is obviously having significant pain issues at this time and her p.o. intake is not entirely clear, therefore she is requiring IV fluids as well.  She is certainly not someone that was dischargeable and manageable at home as she does require IV narcotics, IV fluids at this time, and further disposition pending her course.  She is having no chest pain or shortness of breath. She relates that her bowels moving normally.  She is having no abdominal pain and denies any headache, nausea, or vomiting.  During this fall, she sustained no head trauma nor did she sustain any loss of consciousness.  Her left wrist was splinted and we currently await adequate analgesic impact and further input from Orthopedic Surgery.  PAST MEDICAL HISTORY:  ALLERGIES:  The patient is intolerant to: 1. PENICILLIN. 2. SULFA. 3. STATINS.  CURRENT MEDICATIONS: 1. Aspirin 81  mg daily. 2. Calcitriol 0.25 mcg 1 daily. 3. Calcium and vitamin D b.i.d. 4. Cranberry capsules 475 two daily. 5. Fish oil 1000 one b.i.d. 6. Flaxseed oil 1000 b.i.d. 7. Garlic oil 500 as directed. 8. Glucosamine b.i.d. 9. Insulin 70/30 forty units in the morning, 35 in the evening. 10.Levothyroxine 100 mcg daily. 11.Lidoderm patch as needed. 12.Multivitamin one daily. 13.Oxycodone acetaminophen 7.5/500 three to four daily. 14.Furosemide 120 in the morning 80 in the evening. 15.Norvasc 2.5 b.i.d. 16.Lexapro 20 at bedtime. 17.Folgard RX 2.2, 25 one daily. 18.Hydralazine 25 t.i.d. 19.Xanax 0.5 q.8 h. p.r.n. anxiety. 20.Clonidine 0.1 daily. 21.Allopurinol 100 mg  daily.  MEDICAL ILLNESSES: 1. ASCAD status post PCI with stent 1999. 2. Celiac sprue. 3. 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 4. Gouty arthritis. 5. Hyperlipidemia. 6. Hypertension. 7. Hypothyroidism. 8. Osteoporosis with L4 compression fracture and now recent wrist     fractures. 9. Chronic renal insufficiency.  SURGICAL ILLNESSES: 1. Right shoulder surgery in 2006. 2. Left shoulder fracture in 1997. 3. Left eye cataract repair 2007. 4. Right total knee replacement. 5. Cholecystectomy. 6. Multiple trigger point injections and back injections.  SOCIAL HISTORY:  The patient is married.  Husband has Alzheimer's.  She has 3 children and 2 grandchildren.  She is retired.  No tobacco.  No alcohol.  No drugs.  FAMILY HISTORY:  Remarkable for cardiac disease, diabetes mellitus type 2, breast cancer, fibromyalgia, myasthenia, and celiac disease.  PHYSICAL EXAMINATION:  VITAL SIGNS:  Temperature is 98, blood pressure is 180/90, pulse 90 regular, respiratory rate 18 unlabored, O2 sat 94%. GENERAL:  The patient is awake, alert, higher cortical functioning intact, interactive, frustrated regarding her evening in the emergency room and lack of pain control. HEENT: Good facial symmetry.  Anicteric.  Extraocular movements intact. Oropharynx benign. NECK:  No JVD or bruits.  No thyromegaly.  Carotids 2+/4+. LUNGS:  Clear to auscultation and percussion. CARDIOVASCULAR:  Regular rate and rhythm.  No murmur, gallop, rub, or heave. ABDOMEN:  Soft, nontender.  No hepatosplenomegaly.  Obese. EXTREMITIES:  No cyanosis or clubbing.  Trace to 1+ edema.  She does have a right knee scar.  She does have an abrasion over the left knee which is clean.  No effusions.  Range of motion about the knees are normal.  Shoulder range of motion is normal.  Right arm is casted with a purple cast, distal neurovascular intact.  Left wrist is splinted, distal neurovascular  intact.  Intact distal pulses. BACK:  No CVA or spinal tenderness. NECK:  Supple and nontender. NEUROLOGIC:  Higher cortical functioning is intact.  Cranial nerves II- XII intact.  Motor: Grossly 5/5.  Gait was not assessed.  ASSESSMENT: 1. New left ulnar/radial fracture per Murphy/Wainer, waiting for     casting, currently splinted per ER, conversation through EDP     nonsurgical and clearly needing IV analgesics, and potentially     transiently IV fluids with poor intake because of pain and mobility     issues. 2. Subacute right radial fracture already casted with known     osteoporosis. 3. Essential hypertension. 4. Hyperlipidemia, intolerant to statins. 5. Diabetes mellitus type 2 with vascular complications and renal     complications, degree of control not clear. 6. Chronic kidney disease stage III followed by Dr. Briant Cedar. 7. Chronic pain and chronic depression.  PLAN:  Admitted for IV analgesics, IV fluids.  Ortho to consult and disposition to be sorted out by Social  Services.  Evidently family has an intent for at least transient placement whereby the patient is not in agreement.          ______________________________ Geoffry Paradise, M.D.     RA/MEDQ  D:  05/18/2010  T:  05/18/2010  Job:  [id number]  Electronically Signed by Geoffry Paradise M.D. on 05/24/2010 07:24:36 PM

## 2010-05-26 NOTE — Consult Note (Signed)
[name], [name] NO.:  [id number]  MEDICAL RECORD NO.:  [id number]           PATIENT TYPE:  O  LOCATION:  5011                         FACILITY:  [facility]  PHYSICIAN:  Eulas Post, MD    DATE OF BIRTH:  [birth year]  DATE OF CONSULTATION:  05/18/2010 DATE OF DISCHARGE:                                CONSULTATION   CHIEF COMPLAINT:  Left wrist pain.  HISTORY:  [name] is a pleasant 77 year old woman who has had multiple recent falls and yesterday tripped over a blanket while trying to take it out of the dryer and fell onto her left wrist.  She is about 6 weeks out from a right wrist fracture that was treated in a cast by Dr. Farris Has at our office.  She had acute-onset severe pain in the left wrist as well as deformity and was brought to the emergency room and admitted for management.  She also had an injury to her left knee. She denies any numbness or tingling in either of her upper or lower extremities.  The splint has made her pain better.  It is rated at 8/10.  PAST MEDICAL HISTORY:  Significant for: 1. Coronary artery disease and has had a stent placed in 1999. 2. She also has diabetes. 3. Arthritis. 4. History of gout. 5. Celiac sprue. 6. Hypertension. 7. Hypothyroidism. 8. Chronic renal insufficiency. 9. Fibromyalgia.  FAMILY HISTORY:  Significant for cardiac disease, and her father died of a heart attack somewhere in his 18s.  Her mother died of breast cancer.  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  She is a nonsmoker.  She is right hand dominant.  REVIEW OF SYSTEMS:  Complete review of systems was performed and was negative with the exception of the musculoskeletal complaints as above.  PHYSICAL EXAMINATION:  CONSTITUTIONAL:  She is alert and oriented x3 and is in no acute distress.  She is well developed, well nourished. HEENT:  Eye exam, extraocular movements are  intact. CARDIOVASCULAR:  She has mild pitting edema, 1+ bilateral lower extremities. LYMPHATIC:  She has no axillary or cervical lymphadenopathy. RESPIRATORY:  She has no cyanosis and no increase in respiratory efforts. GI:  Her abdomen is soft, nontender, nondistended with no rebound or guarding and no organomegaly. PSYCHIATRIC:  She is alert and oriented and is appropriate with me throughout our interaction.  She is competent for consent and understands her situation. NEUROLOGIC:  Sensation is intact throughout the hand.  All fingers do flex, extend, and abduct.  This is on both the right and the left sides. MUSCULOSKELETAL:  Her right arm is in a short-arm cast that is well fitted.  Her left arm is in a short-arm splint that does not appear to be too tight.  Her capillary refill is excellent.  She has pain to palpation in the dorsum of her left wrist.  All fingers flex, extend, and abduct.  X-RAYS:  Multiple views of the left wrist demonstrate comminuted intra- articular distal radius fracture.  IMPRESSION:  Comminuted intra-articular distal radius fracture with multiple coexisting  comorbidities including cardiac disease, diabetes, and multiple recent falls.  This is her nondominant arm.  PLAN:  I had a long discussion with the patient as well as her daughter, Carrie [name] at [phone].  I discussed surgical intervention with plate and screw fixation versus nonsurgical management.  They have indicated that they are not interested in surgical intervention, and would like to proceed with nonsurgical management.  I have discussed the risks of malunion as well as nonunion and also the potential for loss of function of that arm, however, having said that, I do think that nonsurgical management given all of her risk factors is certainly an acceptable path.  We will minimize her risk for infection as well as hardware prominence and hardware failure and tendon rupture by going  to nonsurgical route.  I will plan to see her in the office in 1 week and I will plan to convert her to a regular cast.  I do not think that manipulation would provide any substantial benefit, and would potentially hurt her more than help her.  Therefore, we will continue with the splint and elevation and I will see her back in the office in 1 week.  Thanks for this consultation.     Eulas Post, MD     JPL/MEDQ  D:  05/18/2010  T:  05/19/2010  Job:  [id number]  cc:   Geoffry Paradise, M.D.  Electronically Signed by Teryl Lucy MD on 05/26/2010 07:45:12 AM

## 2010-06-10 NOTE — Discharge Summary (Signed)
  NAMENAYDEEN, SPEIRS NO.:  [id number]  MEDICAL RECORD NO.:  [id number]           PATIENT TYPE:  O  LOCATION:  5011                         FACILITY:  [facility]  PHYSICIAN:  Kari Baars, M.D.  DATE OF BIRTH:  12/19/1929  DATE OF ADMISSION:  05/17/2010 DATE OF DISCHARGE:  05/19/2010                              DISCHARGE SUMMARY   This is an addendum to prior discharge summary dated May 19, 2010. Ms. Tisdell remained in the hospital waiting the bed availability for skilled nursing facility rehabilitation.  The details of her hospitalization are documented in the prior discharge summary and are unchanged.  Her pain has remained controlled without any additional issues.  Her updated and final medication list is as follows: 1. Tylenol 650 mg q.4 h. p.r.n. pain. 2. Allopurinol 100 mg daily. 3. Amlodipine 5 mg daily. 4. Colace 100 mg daily. 5. Colchicine 0.6 mg q.8 h. p.r.n. gout. 6. Dilaudid 2 mg 1 pill every 4 hours as needed for severe pain. 7. Fentanyl patch 25 mcg transdermally q.72 h. 8. Lasix 40 mg b.i.d. 9. NovoLog 4 units subcu t.i.d. with meals. 10.NovoLog sliding scale insulin (moderate intensity) q.a.c. 11.Lantus 25 units subcu nightly. 12.Levaquin 250 mg p.o. every other day for 6 days for urinary tract     infection and possible pneumonia. 13.MiraLax 17 g daily p.r.n. constipation. 14.Percocet 5/325 one to two every 6 hours as needed for moderate     pain. 15.Aspirin 81 mg daily. 16.Calcitriol 0.5 mg daily. 17.Clonidine 0.1 mg daily. 18.Fish oil 1000 mg b.i.d. 19.Flaxseed oil 1000 mg b.i.d. 20.Folic acid 1 tablet daily. 21.Glucosamine/chondroitin 1 tablet daily. 22.Hydralazine 25 mg t.i.d. 23.Levothyroxine 100 mcg daily. 24.Lexapro 20 mg daily. 25.Multivitamin daily.  DISPOSITION:  To [facility] for rehabilitation.  HOSPITAL FOLLOWUP:  She should follow up with Dr. Dion Saucier in 1 week for her left wrist  fracture.  She should follow up with Dr. Clelia Croft at [facility] within 1-2 weeks after discharge from the skilled nursing facility.     Kari Baars, M.D.     WS/MEDQ  D:  05/20/2010  T:  05/20/2010  Job:  [id number]  Electronically Signed by W. Buren Kos M.D. on 06/10/2010 02:40:32 PM

## 2010-06-10 NOTE — Discharge Summary (Signed)
[name], SOUTHERN NO.:  [id number]  MEDICAL RECORD NO.:  [id number]           PATIENT TYPE:  O  LOCATION:  5011                         FACILITY:  [facility]  PHYSICIAN:  Kari Baars, M.D.  DATE OF BIRTH:  [birth year]  DATE OF ADMISSION:  05/17/2010 DATE OF DISCHARGE:                              DISCHARGE SUMMARY   ANTICIPATED DATE OF DISCHARGE:  May 20, 2010.  DISCHARGE DIAGNOSES: 1. Left wrist fracture (May 17, 2010). 2. Right wrist fracture (February 2012). 3. Failure to thrive secondary to above and chronic back pain as well     as multiple medical comorbidities. 4. Escherichia coli urinary tract infection. 5. Possible right lung, community-acquired pneumonia. 6. Chronic renal insufficiency stage IV (GFR 16). 7. Diabetes mellitus, type 2 with renal, neurologic, and     ophthalmologic complications. 8. Hypertension. 9. Hyperlipidemia. 10.Osteoporosis with a history of L4 compression fracture and now     bilateral wrist fractures within the past month. 11.Hypothyroidism. 12.Gout. 13.Status post right shoulder surgery (2006). 14.Status post left shoulder surgery (1997). 15.Status post left cataract repair (2007). 16.Status post right total knee replacement. 17.Status post cholecystectomy. 18.Depression.  DISCHARGE MEDICATIONS: 1. Levaquin 500 mg p.o. daily x6 days for UTI and possible pneumonia. 2. Tylenol 650 q.4 h p.r.n., mild pain. 3. Fentanyl patch 25 mcg q.72 h - may need increased dose as the pain     necessitates. 4. Lasix 40 mg b.i.d. - may need increased to 80 mg b.i.d. depending     on volume status. 5. Lantus 25 units subcu at bedtime. 6. NovoLog sliding scale insulin q.a.c. and at bedtime (moderate     intensity scale). 7. Percocet 5/325 one to two q.6 h p.r.n., moderate pain. 8. Dilaudid 0.5 mg p.o. q.4 h p.r.n., severe pain. 9. Aspirin 81 mg daily. 10.Calcitriol 0.5 mg daily. 11.Clonidine 0.1 mg daily. 12.Fish oil 1000 mg  1 tablet b.i.d. 13.Flaxseed oil 1000 mg b.i.d. 14.Folic acid 1 tablet daily. 15.Glucosamine 1 tablet daily. 16.Hydralazine 25 mg q.8 h. 17.Levothyroxine 100 mcg p.o. daily. 18.Lexapro 20 mg p.o. daily. 19.Multivitamin daily.  HOSPITAL PROCEDURES: 1. X-ray of the left wrist (May 17, 2010), comminuted displaced and     angulated intra-articular fracture of the distal left radial     metaphysis.  Comminuted left distal ulnar metaphyseal/styloid     fracture.  Question toward skates lunate ligament, old.  Extensive     degenerative changes at left first [facility] joint. 2. Left forearm x-ray (May 17, 2010), comminuted displaced distal     left radial metaphyseal fracture and distal left ulnar fracture as     above. 3. Left knee x-ray (May 17, 2010) osseous demineralization.     Osteoarthritic changes.  No acute abnormalities. 4. Right finger x-ray (May 17, 2010) advanced osteoarthritis in the     DIP joint with no acute findings. 5. Chest x-ray (May 19, 2010) new opacity in the right midlung.     Possible pneumonia.  Recommend followup.  Stable cardiomegaly with     question of tiny pleural effusions and possibly mild congestion.  HOSPITAL CONSULTS:  Orthopedics - Dr. Ivin Booty  [place].  HISTORY OF PRESENT ILLNESS:  For full details, please see dictated history and physical by Dr. Jacky Kindle.  Briefly, [name] is a 77 year old white female with a complicated medical history, which includes diabetes mellitus with nephropathy, neuropathy, and retinopathy, progressive chronic renal insufficiency, stage IV, and osteoporosis with a history of L4 compression fracture and right wrist fracture, who presented to the emergency department with a complaint of left wrist pain.  The patient has a history of chronic back pain and has been followed in the pain management clinic and is on oral pain medications as well as topical Lidoderm patch.  She had a recent fall about 5 weeks ago and  sustained a right wrist fracture.  This was casted by [facility] and has been followed by them.  She has had another fall after tripping while carrying blankets and landed on her left wrist suffering severe left wrist pain, which prompted her to come to the emergency department that showed a comminuted displaced and angulated fracture of the distal left radial metaphysis and left distal ulnar metaphyseal/styloid fracture.  Orthopedics was contacted and her fraction was deemed nonsurgical.  The family did not feel that they could manage at her home due to the severity of her pain and loss of function.  At baseline, the patient does ambulate with a rolling walker and is unable to use her walker due to her bilateral wrist fractures. Therefore, she was admitted for pain management and for placement for rehab.  HOSPITAL COURSE:  The patient was admitted to a medical bed.  She was placed on a fentanyl patch and IV Dilaudid as needed for breakthrough pain.  She also has Percocet from less severe pain.  With this regimen, her wrist pain has been well-controlled.  She continues to have some back pain (her baseline level of pain) when in bed.  She has otherwise been without significant complaint.  Urinalysis was obtained in the emergency department that did show too numerous to count white blood cells.  Urine culture has subsequently grown greater than 100,000 E. coli, sensitivities pending.  She is being started on Levaquin to cover this and should continue this for a total of 7 days.  In addition, chest x-ray was performed that showed a possible right mid lung opacity, consistent with possible pneumonia.  She is really not having any pulmonary symptoms, but Levaquin should adequately cover community- acquired pneumonia.  She will need a repeat chest x-ray within 2 weeks after completing antibiotics.  At this point, the patient is stable for discharge pending bed availability.  Dr. Dion Saucier  has continued to recommend nonsurgical management of her wrist fractures and has recommended follow up at their office ([facility] in 1 week).  Her pain control is adequate and she will be transitioned to a p.o. regimen with the fentanyl patch.  HOSPITAL LABS:  CBC on May 18, 2010, showed a white count of 5.1, hemoglobin 9.6, platelets 206.  CMET showed a sodium 138, potassium 3.5, chloride 101, bicarb 28, BUN 38, creatinine 2.83, glucose 151.  Liver function tests were normal.  Albumin 2.9.  Urinalysis showed too numerous to count white blood cells.  TSH was 1.07 with a T4 of  7.3. Of note, follow up labs will be obtained on the morning of discharge.  DISCHARGE INSTRUCTIONS:  The patient should continue physical therapy and occupational therapy for rehabilitation in an effort to return home with independent.  She does have a daughter, who is involved in her care,  but she is also a primary caregiver for her husband, who has severe Alzheimer's dementia.  She continued to check her sugars before each meal and cover with sliding scale insulin.  Monitor for edema.  If her swelling increases, we will need to increase her Lasix, which was decreased slightly during this hospitalization.  DISPOSITION:  Plan for discharge to [facility] tomorrow after Medicare required 3-day hospital stay.  CONDITION ON DISCHARGE:  Improved and stable.     Kari Baars, M.D.     WS/MEDQ  D:  05/19/2010  T:  05/19/2010  Job:  [id number]  cc:   [facility] Eulas Post, MD  Electronically Signed by Lacretia Nicks. Buren Kos M.D. on 06/10/2010 02:40:26 PM

## 2010-06-26 ENCOUNTER — Ambulatory Visit: Payer: Medicare Other | Admitting: Physical Medicine & Rehabilitation

## 2010-06-29 ENCOUNTER — Ambulatory Visit: Payer: Medicare Other | Admitting: Physical Medicine & Rehabilitation

## 2010-07-07 NOTE — Assessment & Plan Note (Signed)
Carrie [name] follows up today.  She underwent a left L5 dorsal ramus  radiofrequency neurotomy and left L4 and L3 medial branch radiofrequency  neurotomy on September 07, 2007.  Immediately afterwards the pain went from 8  to 5, but states that really she has not achieved any significant pain  relief.   POST PROCEDURE:  I did review her images and appeared to have right knee  replacement.  I did go over with her that success rate for repeat  procedure is 85%, the right side did work good.  We also discussed that  sacroiliac joint may have similar pain characteristics and would not be  improved by this type of procedure.  She is aware of this as well.   She can walk 3-5 minutes at a time.  She does not drive or climb steps.  She needs some assistance with meal prep, household duties, shopping,  bathing, and dressing.  She has fear falling.  She has some night sweats  now.  Sleep apnea, she followed with Dr. Amil Amen from Cardiology as well  as with Dr. Clelia Croft.   PAST HISTORY:  Thyroid problems, high blood pressure, and arthritis.   EXAM:  She has normal strength, bilateral hip flexors, knee extensors,  ankle dorsiflexors.  Hip internal and external rotation is normal and  extremities show no evidence of edema.  Deep tendon reflexes are 1+  bilateral knees and ankles.   IMPRESSION:  Persistent left-sided low back pain, question whether she  just did not respond to radiofrequency that she had on the right side  versus whether she has a different pain generator.  She does have some  characteristics of a sacroiliac pain and she does have pain that is  extending more into the thigh than it had previously.  Other possibility  would be sciatica.   PLAN:  1. We will do sacroiliac injection, failing this consider epidural.  2. Continue oxycodone, but increase to 7.5/325 one p.o. b.i.d.      Carrie [name], M.D.  Electronically Signed     AEK/MedQ  D:  10/09/2007 17:05:55  T:   10/10/2007 05:55:19  Job #:  [id number]   cc:   Kari Baars, M.D.  Fax: [phone]

## 2010-07-07 NOTE — Procedures (Signed)
Carrie [name], VALLIN NO.:  [id number]   MEDICAL RECORD NO.:  [id number]          PATIENT TYPE:  REC   LOCATION:  TPC                          FACILITY:  [facility]   PHYSICIAN:  Erick Colace, M.D.DATE OF BIRTH:  1929-11-18   DATE OF PROCEDURE:  11/20/2007  DATE OF DISCHARGE:                               OPERATIVE REPORT   PROCEDURE:  Right sacroiliac injection under fluoroscopic guidance.   INDICATIONS:  Right sacroiliac pain.  She has had good relief of her  left sacroiliac pain following injection approximately 1 month ago.   Informed consent was obtained after describing risks and benefits of the  procedure with the patient.  These include bleeding, bruising, and  infection.  She elects to proceed and has given written consent.  The  patient was placed prone on fluoroscopy table.  Betadine prep, sterile  drape.  A 25-gauge 1-1/2 inch and half needle was used to anesthetize  the skin and subcutaneous tissue, 1% lidocaine x2 mL.  She was buffered  1-10 with Bicarbon.  Then, a 25-gauge 3-1/2 inch spinal needle was  inserted in the right SI joint.  AP, lateral, and oblique imaging  utilized.  Omnipaque 180 x 0.5 mL demonstrated good joint outline  followed by injection of 0.5 mL of 40 mg/mL Depo-Medrol, 1 mL of 2% MPF  lidocaine.  The patient tolerated the procedure well.  Pre-and  postinjection vitals stable.  Postinjection instructions were given.  Followup in 1 month with review of shoulder x-rays and possible  injection.      Erick Colace, M.D.  Electronically Signed     AEK/MEDQ  D:  11/20/2007 10:11:39  T:  11/21/2007 00:19:39  Job:  [id number]

## 2010-07-07 NOTE — Assessment & Plan Note (Signed)
HISTORY OF PRESENT ILLNESS:  This is a 77 year old female who follows up  with me after sacroiliac injection to right SI joint.  She has had  improvement in function since her injection on November 20, 2007.  She  still has some balance problems and could not go to physical therapy due  to transportation issues.  She does have difficulty getting out of the  house.  She does not drive.  Her daughter has to work.  Has had a  increasing right shoulder pain.  She has had x-rays done since last  visit.  She is here to review results.   The patient's average pain 6/10.  Interferes with activity at a 6 to  7/10 level.  Needs assistance with bathing, dressing, meal prep,  household duties, and shopping.   PHYSICAL EXAMINATION:  VITAL SIGNS:  Blood pressure 147/46, pulse 69,  respirations 16, and O2 sat 98% on room air.  GENERAL:  Well-developed, well-nourished female in no acute distress.  Orientation x3.  Affect is slurred.  Gait is with a walker.  EXTREMITIES:  Without edema.  She does have some bruises in the forearm  area.  She has pain with abduction of the right shoulder at around 90  degrees.   Review x-rays showed advanced osteoarthritic changes in right  glenohumeral joint.  No significant arthritis in the [facility] joint.  No  hooked acromion.  She has mild osteopenia.   IMPRESSION:  Osteoarthritis right shoulder, decreased joint space, and  subchondral sclerosis in the setting of generalized osteopenia.   PLAN:  1. We will go ahead and inject shoulder today given that narcotic      analgesics are really not providing adequate relief and pain is      interfering with self-care and mobility.  2. Sacroiliac disorder, improvement after injection.  3. Lumbar spinal stenosis balance disorder.  We will have home health      come in for balance program.      Erick Colace, M.D.  Electronically Signed     AEK/MedQ  D:  12/19/2007 10:02:40  T:  12/19/2007 22:09:47  Job #:   [id number]

## 2010-07-07 NOTE — Assessment & Plan Note (Signed)
Ms. Miramontes returns today.  She is dong well in general status post L2-3  transforaminal lumbar epidural steroid injection under fluoroscopic  guidance performed March 09, 2007.  She did have an episode of right  lower extremity thigh pain last night, but it has gone away again today.  She has had no further falls.  Her thumb contusion and ankle sprain have  resolved.   She has had no new medication changes per her report since I last saw  her.  She rates her pain currently at a 1, but with activity at a 4.  She has about 20 oxycodone 5 mg left from March 13, 2007.  She could  not fill oxycodone 5 mg because pharmacies are out of it and is  wondering if there is an alternative.   REVIEW OF SYSTEMS:  Positive for lower extremity swelling, sleep  problems.   FUNCTIONAL REVIEW:  She has some difficulty with dressing, bathing, meal  prep, household duties and shopping.  She uses a walker outside the  home, either a cane or no device in the home.  She does not drive.  She  does not climb steps.   SOCIAL HISTORY:  Married.   FAMILY HISTORY:  Diabetes, hypertension and coronary artery disease.   Blood pressure 152/44, pulse 61, respiratory rate 18, O2 saturation 96%  on room air.  GENERAL:  In no acute distress.  Mood and affect appropriate.  GAIT:  Is using a walker.  She is forward flexed at the hips.  Lower extremity strength is 5/5 bilateral hip flexor, knee extensor,  ankle dorsiflexor.  Hip range of motion is normal.  Knee range of motion  is normal, but she does have some medial lateral instability of the left  knee.   IMPRESSION:  1. Lumbar facet syndrome, improvement in axial pain status post      radiofrequency merotomy.  2. Right L2-3 radiculopathy, generally improved although it appears      that the injection may be wearing off.   PLAN:  We will go ahead and schedule her in a month for repeat  injection.  Should she be doing okay at that point, we can hold off but  it has already been 2-1/2 months and I would anticipate that over the  next month she should have some recurrence as evidenced by some  recurrence of symptoms yesterday.   1. Continue Limbrel 250 b.i.d.  2. Continue oxycodone 5/500.  We will substitute Percocet 5/325 b.i.d.  3. Lidoderm patch low back on 12, off 12.  4. She can continue with Biofreeze as she is doing.      Erick Colace, M.D.  Electronically Signed     AEK/MedQ  D:  05/19/2007 14:01:45  T:  05/19/2007 14:51:19  Job #:  [id number]   cc:   Dr. Sherryll Burger

## 2010-07-07 NOTE — Procedures (Signed)
NAMEMAIZEE, REINHOLD NO.:  [id number]   MEDICAL RECORD NO.:  [id number]         PATIENT TYPE:  [facility]   LOCATION:                                 FACILITY:   PHYSICIAN:  Erick Colace, M.D.DATE OF BIRTH:  [birth year]   DATE OF PROCEDURE:  DATE OF DISCHARGE:                               OPERATIVE REPORT   PROCEDURE:  Right glenohumeral injection.   INDICATION:  Advanced osteoarthritis with pain only partially responsive  to medication management and other conservative care.   Pain does inhibit self care such as dressing and bathing.   Informed consent was obtained after describing risks and benefits of the  procedure with the patient.  These include bleeding, bruising,  infection.  She elects to proceed and has given written consent.  The  patient was placed in a seated position, area marked, prepped with  Betadine and alcohol, entered with 25-gauge needle, 1 mL of 40 mg/mL  Depo-Medrol and 4 mL of 1% MPF lidocaine injected after negative  drawback for blood.  The patient tolerated the procedure well.  Postinjection instructions given.      Erick Colace, M.D.  Electronically Signed     AEK/MEDQ  D:  12/19/2007 09:57:56  T:  12/19/2007 22:36:04  Job:  [id number]

## 2010-07-07 NOTE — Procedures (Signed)
Carrie [name], LOCKAMY NO.:  [id number]   MEDICAL RECORD NO.:  [id number]           PATIENT TYPE:   LOCATION:                                 FACILITY:   PHYSICIAN:  Erick Colace, M.D.DATE OF BIRTH:  [birth year]   DATE OF PROCEDURE:  DATE OF DISCHARGE:                               OPERATIVE REPORT   This is bilateral L5 dorsal ramus injection bilateral L4 medial branch  block, bilateral L3 medial branch block under fluoroscopic guidance.   INDICATIONS:  Lumbar facet mediated pain.  She also has sacroiliac pain.  She has had good relief with both RF lumbar spine, as well as short-term  relief with SI joint.  She is having increasing pain and further  definition of pain generator is being sought to see whether repeat RF is  needed at this time.   Pain is only partial response to medication management and interferes  self care mobility.   Informed consent was obtained after describing risks and benefits of the  procedure with the patient.  These include bleeding, bruising,  infection.  She elects to proceed and has given written consent.  The  patient placed prone on fluoroscopy table.  Betadine prep, sterile  drape, 25-gauge inch and half needle was used to anesthetize the skin  and subcutaneous tissue with 1% lidocaine x2 mLs and a 22-gauge 3.5-inch  spinal needle was inserted under fluoroscopic guidance for starting left  S1 SAP sacral ala junction.  Bone contact made, confirmed with lateral  imaging.  Omnipaque 180 under live fluoro demonstrated no intravascular  uptake and 0.5 mL of a solution containing 1 mL of 40 mg/mL Depo-Medrol,  and 2 mL of 2% MPF lidocaine was injected.  The left L5 SAP transverse  process junction targeted.  Bone contact made, confirmed with lateral  imaging.  Omnipaque 180 under live fluoro demonstrated no intravascular  uptake and 0.5 mL of dexamethasone lidocaine solution was injected.  The  left L4 SAP transverse process  junction targeted.  Bone contact made,  confirmed with lateral imaging.  Omnipaque 180 under live fluoro  demonstrated no intravascular uptake and 0.5 mL of the Depo-Medrol  lidocaine solution was injected.  The same procedure was repeated on the  right side.  Using same needle injectate and technique, the patient  tolerated the procedure well.  Pre and post injection vitals stable.  Post injection instructions given.  Pre injection pain level 5-6/10,  post injection 0/10.  Given this, we will repeat her RF on the right  side first.      Erick Colace, M.D.  Electronically Signed     AEK/MEDQ  D:  04/15/2008 14:06:34  T:  04/16/2008 02:14:50  Job:  [id number]

## 2010-07-07 NOTE — Procedures (Signed)
Carrie [name], Carrie [name] NO.:  [id number]   MEDICAL RECORD NO.:  [id number]          PATIENT TYPE:  REC   LOCATION:  [facility]                          FACILITY:  [facility]   PHYSICIAN:  Erick Colace, M.D.DATE OF BIRTH:  [birth year]   DATE OF PROCEDURE:  12/01/2006  DATE OF DISCHARGE:                               OPERATIVE REPORT   PROCEDURE:  Right sided L5 dorsal ramus injection and radiofrequency  neurotomy, right L4 medial branch block and radiofrequency neurotomy,  right L3 medial branch block and radiofrequency neurotomy, under  fluoroscopic guidance.   INDICATIONS:  Lumbar facet mediated pain is demonstrated by reduction in  pain post medial branch blocks x2. Her last medial branch block was done  November 14, 2006.  She had 9/10 pre and 3/10 post and the prior one  was performed October 17, 2006, 8/10 pre and 4-5 post. Her pain  interferes with her stopping, meal prep, household duties.   Informed consent was obtained after describing the risks and benefits of  the procedure to the patient.  These include bleeding, bruising,  infection, loss of bowel and bladder function, temporary or permanent  paralysis.  She elects to proceed and has given written consent.  I also  discussed this with the patient's daughter prior to the procedure.   DESCRIPTION OF PROCEDURE:  The patient was placed prone on the  fluoroscopy table.  Betadine prep, sterile drape.  A 25 gauge 1 1/2 inch  needle was used to anesthetize the skin and subcu tissue with 1%  lidocaine x2 mL.  Then, a 20 gauge 10 cm RF needle with 10 mm curved  active tip was inserted under fluoroscopic guidance, first charting the  right S1 SAP sacral ala junction, bone contact made, confirmed with  lateral imaging.  Motor stem x3 volts demonstrated no lower extremity  twitch followed by injection of 1 mL of a solution containing 1 mL of 4  mg per mL dexamethasone and 2 mL of 1% lidocaine followed by RF  lesioning  at 7 degrees x 70 seconds.  Then, the right L5 SAP transverse  process junction targeted, bone contact made, confirmed with lateral  imaging.  Motor stem x3 volts demonstrated no lower extremity twitch  followed by injection of 0.1 mL of the dexamethasone lidocaine solution  and RF lesioning at 7 degrees x 70 seconds.  Then, the right L4 SAP  transverse junction targeted, bone contact made, confirmed with lateral  imaging.  Motor stem x3 volts demonstrated no lower extremity twitch  followed by RF lesioning at 7 degrees x 70 seconds.  The patient  tolerated the procedure well.  Pre and post injection vitals stable.      Erick Colace, M.D.  Electronically Signed     AEK/MEDQ  D:  12/01/2006 12:59:17  T:  12/01/2006 22:10:22  Job:  [id number]

## 2010-07-07 NOTE — Assessment & Plan Note (Signed)
Ms. Espino returns.  She has had a left-sided L5 dorsal ramus  injection, radiofrequency neurotomy of the L4, medial branch block, and  radiofrequency neurotomy of the left L3, medial branch block and  radiofrequency neurotomy of December 29, 2006.   She has had good improvement in her lumbar pain related to that.  She  has noted some more pain going down her legs.  She has had no falls.  No  other new medical problems.  No sweats or chills.  No loss of bowel or  bladder function.   CURRENT MEDICATIONS:  1. Oxycodone 5 mg b.i.d.  2. Lidoderm patch to the low back.  3. Limbrel 250 b.i.d.   INTERVAL MEDICAL HISTORY:  Awaiting nephrology evaluation for  proteinuria.   SOCIAL HISTORY:  Married, lives with husband.   PHYSICAL EXAMINATION:  Blood pressure 138/50, pulse 48, respiratory rate  16, O2 sat 95% on room air.  Mood and affect are appropriate.  She is obese.  She has difficulty  getting up and walking with the walker.  Her knee reflexes are reduced on the right but has had a right TKR.  She  has some pain in her left knee when I do DTRs as well but reduced  reflexes overall as well as in the lower extremities.   Review of prior imaging studies from L3-4 broad-based posterior disk,  causing some right lateral recess stenosis compressing the right L4  nerve root and right posterolateral disk, L2-3, possibly compressing  right L3 nerve root.  This is the MRI from Jul 15, 2006.  The right  lower extremity symptoms are worse than the left.   IMPRESSION:  1. Lumbar facet syndrome, improvement in axial pain, status post      radiofrequency neurotomy.  2. Right greater than left lower extremity radicular symptoms, would      be consistent with right L3-4 radiculopathy.  Will have her come      back for transforaminal epidural steroid injection.  3. Chronic pain management.  Will continue oxycodone 5/325 1 p.o.      b.i.d., Limbrel 250 b.i.d., and Lidoderm patch to low back, on  12,      off 12.      Erick Colace, M.D.  Electronically Signed     AEK/MedQ  D:  01/27/2007 15:27:43  T:  01/27/2007 21:50:56  Job #:  [id number]

## 2010-07-07 NOTE — Procedures (Signed)
[name], Carrie [name] NO.:  [id number]   MEDICAL RECORD NO.:  [id number]           PATIENT TYPE:   LOCATION:                                 FACILITY:   PHYSICIAN:  Erick Colace, M.D.DATE OF BIRTH:  1929/08/03   DATE OF PROCEDURE:  06/17/2008  DATE OF DISCHARGE:                               OPERATIVE REPORT   PROCEDURE:  Left L5 dorsal ramus radiofrequency neurotomy, left L4  medial branch radiofrequency neurotomy, left L3 medial branch  radiofrequency neurotomy under fluoroscopic guidance.   INDICATIONS:  Lumbar spondylosis without myelopathy.  She has had good  results with right-sided radiofrequency last month.  She has had  previous good relief results with bilateral radiofrequency.  Her pain is  only partially responds to medication management and interferes with  self-care mobility and responds only partially to medication management.   Informed consent was obtained after describing risks and benefits of the  procedure with the patient.  These include bleeding, bruising or  infection.  She elects to proceed and has given written consent the  patient placed prone on fluoroscopy table.  Betadine prep, sterile  drape, a 25-gauge 1-1/2-inch needle was used to anesthetize skin and  subcu tissue 1% lidocaine x2 mL.  Then, a 20-gauge 10-cm RF needle with  a 10-mm curved active tip was inserted under fluoroscopic guidance  targeting left S1 SAP sacral ala junction.  Bone contact made confirmed  with lateral imaging.  Sensory stim at 50 Hz followed by motor stim at 2  Hz confirmed proper needle location followed by injection of 1 mL of  solution containing 1 mL of 4 mg/mL dexamethasone, 2 mL of 2% lidocaine  followed by radiofrequency lesioning at 70 degrees Celsius for 70  seconds.  Then, the left L5 SAP transverse process junction targeted.  Bone contact made confirmed with lateral imaging.  Sensory stim at 50 Hz  followed by motor stim at 2 Hz confirmed  proper needle location followed  by injection of 1 mL of dexamethasone and lidocaine solution and  radiofrequency lesioning at 70 degrees Celsius for 70 seconds.  Then,  the left L4 SAP transverse junction targeted.  Bone contact made,  confirmed with lateral imaging.  Sensory stim at 50 Hz followed by motor  stim at 2 Hz confirmed proper needle location followed by injection of 1  mL of dexamethasone and lidocaine solution and radiofrequency lesioning  at 70 degrees Celsius for 70 seconds.  The patient tolerated procedure  well.  Pre- and post-injection vitals stable.  Post-injection  instructions given.      Erick Colace, M.D.  Electronically Signed     AEK/MEDQ  D:  06/17/2008 10:57:03  T:  06/18/2008 00:29:57  Job:  [id number]

## 2010-07-07 NOTE — Assessment & Plan Note (Signed)
Ms. Duncombe returns today.  She has had an L2-3 transforaminal lumbar  epidural steroid injection under fluoroscopic guidance on March 09, 2007 and she has had a good response, in terms of her right lower  extremity pain.  Her hip pain is much improved.  She did, however, fall  this morning.  She felt like she was getting a bit hypoglycemic and  before she could drink her orange juice, she hit her left hand against  the table and twisted her right ankle.  She fortunately fells into her  wheelchair, so she did not have any other significant injury.   She did not have any head trauma.  She states her pain was about a 4 out  of 10 in her left thumb and in her right ankle.  She is able to walk on  the right lower extremity.  She has no numbness or tingling in those  areas.   She is accompanied by her daughter.   PHYSICAL EXAMINATION:  VITAL SIGNS:  Blood pressure 165/103, pulse 68,  respirations 18, O2 sat 97% on room air.  MUSCULOSKELETAL:  Her right foot has no evidence of bruising.  No  evidence of joint swelling or soft tissue swelling.  She has pain with  inversion of the foot, greater than with eversion and this is mainly on  the dorsum of the foot.  There is no step off over the metatarsals.  She  does have pain to palpation over the metatarsals, particularly the 5th  metatarsal greater than the 4th palpating both from the dorsal and  plantar surface.  She has no evidence of skin discoloration.  No  evidence of toe discoloration.  Pulses are good.  Her left thumb has  ecchymosis distal to the IP joint.  She has intact sensation.  She is  able to bend her thumb and extend her thumb.  She has no pain in the  carpal metacarpal area.  Her ambulation is slow, unsteady, holds onto  the exam table while she ambulates.  She is able to bear weight without  much pain in the right lower extremity.   IMPRESSION:  1. History of L2-3 lumbar radiculopathy, right lower extremity,  improved.  2. Left thumb contusion.  It does not appear intraarticular.  3. Right ankle sprain.   Advised elevation, icing 30 minutes at a time three times a day for the  next two days.  Advised thumb splint if she has continued pain.  This  can be bought at the pharmacy.  I will see her back in 1-2 weeks.  If  she still has continued pain in those areas, I will order x-rays.      Erick Colace, M.D.  Electronically Signed     AEK/MedQ  D:  04/14/2007 16:13:48  T:  04/15/2007 17:01:15  Job #:  [id number]   cc:   Kari Baars, M.D.  Fax: [phone]

## 2010-07-07 NOTE — Procedures (Signed)
[name], COLLET NO.:  [id number]   MEDICAL RECORD NO.:  [id number]          PATIENT TYPE:  REC   LOCATION:  [facility]                          FACILITY:  [facility]   PHYSICIAN:  Erick Colace, M.D.DATE OF BIRTH:  October 23, 1929   DATE OF PROCEDURE:  11/14/2006  DATE OF DISCHARGE:                               OPERATIVE REPORT   Average pain 8/10, low back left greater than right side, radiating into  the thighs.  She had 5 days of very good relief with reduction of pain  medication usage following the last set of medial branch blocks.  The  pain is persistent despite narcotic analgesic medication and interferes  with bathing, meal prep, household duties, shopping.  Informed consent  was obtained after describing risks and benefits of the procedure to the  patient.  These include bleeding, bruising, infection, loss of bowel or  bladder function, temporary or permanent paralysis.  She elects to  proceed and has given written consent.   The patient was placed prone on fluoroscopy table.  Betadine prep,  sterile drape.  A 25-gauge inch and a half needle was used to incise  skin and subcu tissue with 1% lidocaine x2 mL, and a 22-gauge 5-inche  spinal needle was inserted under fluoroscopic guidance, first targeting  the left S1 SAP-sacral ala junction, bone contact made, confirmed with  lateral imaging well.  Omnipaque 180 x0.5 mL demonstrated no  intravascular uptake and 0.5 mL of a Depo-Medrol-lidocaine solution was  injected.  Then the left L5 SAP-transverse process junction targeted,  bone contact made, confirmed with lateral imaging.  Omnipaque 180 x0.5  mL demonstrated no intravascular uptake.  Then 0.5 mL the Depo-Medrol-  lidocaine solution was injected.  Then the left L4 SAP-transverse  junction targeted, bone contact made, confirmed with lateral imaging.  Omnipaque 180 x0.5 mL demonstrated no intravascular uptake, then 0.5 mL  of the dexamethasone-lidocaine  solution injected.  This same procedure  was done on the corresponding levels on the opposite side, on the right  side with the same injectate, same technique, same equipment.  The  patient tolerated the procedure well.  Pre injection pain level was 7-  8/10.  Post injection will give a pain diary and if once again obtains  good, i.e. greater 50%, relief following this set of medial branch  blocks, will schedule for radiofrequency neurotomy starting on the left  side.      Erick Colace, M.D.  Electronically Signed     AEK/MEDQ  D:  11/14/2006 13:24:23  T:  11/15/2006 06:56:37  Job:  [id number]

## 2010-07-07 NOTE — Assessment & Plan Note (Signed)
Carrie [name] follows up.  I last saw her on January 16, 2008.  She has  right shoulder pain due to osteoarthritis, improvement with right  shoulder injection on December 19, 2007, this is starting to wear off,  however.  When I last saw her, she really had taken just 22 of her  oxycodone in 1 month's time, but has been taking more with the cold  weather.   Her average pain is 5-6/10, 4 after medications.  Sleep is poor.  She  indicates her relief from meds is good when she takes about 2 a day,  which she needs to do on most days, but not every day.  She describes  her pain is constant and aching in low back area as well as knees,  ankles, and right shoulder.  She uses a cane or walker to ambulate.  She  does not use steps.  She does not drive anymore.  She needs assist with  dressing, bathing, meal prep, household duties and shopping.   REVIEW OF SYSTEMS:  Trouble walking, weakness, limb swelling, and sleep  problems.   SOCIAL HISTORY:  She is married, lives with her husband.   PHYSICAL EXAMINATION:  VITAL SIGNS:  Her blood pressure is 145/65, pulse  53, respiratory rate 18, and O2 sat 97% on room air.  GENERAL:  No acute distress, orientation x3.  Affect is alert.  She  walks with a walker.  EXTREMITIES:  Without edema.  Coordination is normal.  Deep tendon  reflexes are normal with exception of the right knee, which is status  post right PKR.  Motor strength is normal in upper and lower  extremities.   Her back has reduced range of motion 50% forward flexion and extension  partially related to poor balance.  She has tenderness to palpation in  right PSIS area as well as left sided low back.  She has positive  impingement sign with some crepitus in right shoulder.   IMPRESSION:  1. Osteoarthritis, right shoulder, improved after injection starting      to wear off, we can repeat this in 1 month.  2. Sacroiliac disorder, status post sacroiliac injection 2 months ago.      We can  potentially repeat this in another month or so.  3. Left side low back pain, consider repeat ORIF, last ORIF      approximately 6 months ago.      Erick Colace, M.D.  Electronically Signed     AEK/MedQ  D:  02/20/2008 14:42:54  T:  02/21/2008 05:47:32  Job #:  [id number]

## 2010-07-07 NOTE — Procedures (Signed)
NAMEVELMER, BROADFOOT NO.:  [id number]   MEDICAL RECORD NO.:  [id number]          PATIENT TYPE:  REC   LOCATION:  [facility]                          FACILITY:  [facility]   PHYSICIAN:  Erick Colace, M.D.DATE OF BIRTH:  26-Jun-1929   DATE OF PROCEDURE:  03/09/2007  DATE OF DISCHARGE:                               OPERATIVE REPORT   This is a right L2-3 transforaminal lumbar epidural steroid injection  under fluoroscopic guidance.   Dictation cancelled by dictator.      Erick Colace, M.D.  Electronically Signed     AEK/MEDQ  D:  03/09/2007 16:46:05  T:  03/10/2007 06:11:36  Job:  [id number]

## 2010-07-07 NOTE — Procedures (Signed)
Carrie [name], NEWLUN NO.:  [id number]   MEDICAL RECORD NO.:  [id number]         PATIENT TYPE:  [facility]   LOCATION:                                 FACILITY:   PHYSICIAN:  Erick Colace, M.D.DATE OF BIRTH:  04/18/1929   DATE OF PROCEDURE:  DATE OF DISCHARGE:                               OPERATIVE REPORT   A 77 year old female with left-sided low back pain, which persisted  despite lumbar radiofrequency on the left.  Her right-sided low back  pain improved after radiofrequency.  She has pain in the sacroiliac  region.  Her pain is only partially responsive to medication management.  It interferes with self-care and mobility, even showering.   Informed consent was obtained after describing the risks and benefits of  the procedure with the patient.  These include bleeding, bruising,  infection, and temporary or permanent left lower extremity weakness.   The patient elects to proceed and has given written consent.  The  patient was placed prone on the fluoroscopy table.  Betadine prep,  sterile drape.  A 25-gauge 1-1/2-inch needle was used to anesthetize the  skin and subcu tissue with 1% lidocaine x2 mL.  Then, a 25-gauge 3-1/2-  inch spinal needle was inserted to the left SI joint.  AP, lateral, and  oblique imaging.  Omnipaque 180 x 0.5 mL demonstrated no intravascular  uptake and 0.5 mL of 40 mg/mL Depo-Medrol plus 1 mL of 2% MPF lidocaine  injected.  The patient tolerated the procedure well.  Pre- and post-  injection vitals were stable.  Post-injection instructions were given.  If no particular improvement with this, would then consider epidural  steroid injection under fluoroscopic guidance, and may need to be re-  imaged.      Erick Colace, M.D.  Electronically Signed     AEK/MEDQ  D:  10/23/2007 04:54:09  T:  10/23/2007 81:19:14  Job:  [id number]

## 2010-07-07 NOTE — Procedures (Signed)
NAMEENYA, BUREAU NO.:  [id number]   MEDICAL RECORD NO.:  [id number]          PATIENT TYPE:  REC   LOCATION:  [facility]                          FACILITY:  [facility]   PHYSICIAN:  Erick Colace, M.D.DATE OF BIRTH:  [birth year]   DATE OF PROCEDURE:  12/01/2006  DATE OF DISCHARGE:  12/14/2006                               OPERATIVE REPORT   This is a left-sided L5 dorsal ramus injection and radiofrequency  neurotomy, left L4 medial branch block and radiofrequency neurotomy,  left L3 medial branch block and radiofrequency neurotomy under  fluoroscopic guidance.   INDICATIONS:  Lumbar facet-mediated pain demonstrated by reduction in  pain post medial branch blocks x2 at these same levels done in September  and August and improvement of pain with right-sided medial branch blocks  and RF procedure done December 01, 2006.  Please see that note.   Her pain interferes with meal prep, household duties.   Informed consent was obtained after describing the risks and benefits of  the procedure to the patient.  These include bleeding, bruising,  infection, temporary or permanent paralysis.  She elects to proceed and  has given written consent.  I have discussed this with the patient's  daughter prior to the procedure as well.   DESCRIPTION OF PROCEDURE:  The patient placed prone on fluoroscopy  table.  Betadine prep, sterile drape.  A 25-gauge inch and half needle  was used to anesthetize skin and subcu tissue, 1% lidocaine x2 mL.  Then  a 20-gauge 10-cm RF needle with a 10-mm curved active tip was inserted  under fluoroscopic guidance, first targeting the left S1 SAP-sacral ala  junction, bone contact made, confirmed with lateral imaging.  Motor Stim  x3 V demonstrated no lower extremity twitch, followed by injection of 1  mL of a solution containing 1 mL of 4 mg/mL dexamethasone and 2 mL of 1%  lidocaine, followed by RF lesioning at 70 degrees x70 seconds.  Then the  left  L5 SAP-transverse process junction targeted, bone contact made,  confirmed with lateral imaging.  Motor Stim x3 V demonstrated no lower  extremity twitch, followed by injection of 1 mL of the dexamethasone-  lidocaine solution and RF lesioning at 70 degrees x70 seconds.  Then the  right L4 SAP-transverse process junction targeted, bone contact made,  confirmed with lateral imaging.  Motor Stim x3 V demonstrated no lower  extremity twitch, followed by RF lesioning at 70 degrees x70 seconds.  The patient tolerated the procedure well.  Pre and post injection vitals  stable.  Post injection instructions given.  Return in 1 month for  follow-up visit.      Erick Colace, M.D.  Electronically Signed     AEK/MEDQ  D:  12/29/2006 14:05:08  T:  12/30/2006 08:07:13  Job:  [id number]

## 2010-07-07 NOTE — Assessment & Plan Note (Signed)
PRIMARY CARE PHYSICIAN:  Kari Baars, MD   The patient returns today.  She had some effect with her lumbar RF on  the left side.  It was not quite as helpful as on the right side but  still is having a beneficial effect.  Her main complaint today is right  shoulder pain.  She states that she has had no recent falls.  She has  pain around the shoulder blades.  She denies any significant neck pain.  She has no numbness or tingling in the hand or arm.  Pain does improve  with heat and medications.  She does have some ADL restrictions,  dressing, bathing, meal prep, household duties, and shopping.  She has  sleep problems and night sweats as well.   Past history is significant for diabetes, kidney disease, thyroid  disease, high blood pressure, and arthritis.   SOCIAL HISTORY:  Married, lives with her husband.  Her daughter brought  her today.   PHYSICAL EXAMINATION:  Blood pressure 181/43, pulse 55, respirations 18,  O2 sat 95% on room air.  General is in no acute stress.  Orientation x3.  Affect alert.  She is using a walker.  Her back has no tenderness to  palpation in the lumbar paraspinal muscles.  No evidence of erythema.  Her hips have mild pain with palpation over the trochanteric bursa.  She  has positive impingement testing in the right shoulder at 90 degrees.  Negative horizontal abduction pain.  She has mildly reduced internal and  external rotation in right shoulder.  Intact sensation and strength in  the upper extremity.  Neck range of motion is 50% forward flexion and  extension, lateral rotation, and bending.  There is mild tenderness to  palpation along the parascapular area.   IMPRESSION:  1. Lumbar spondylosis without myelopathy.  She has had general      improvement after radiofrequency procedure in bilateral low back      area.  2. Right shoulder pain.  If she has more of a subacromial bursitis,      picture was to do an injection today.  I will see her back in  a      month to see how this helped her.  We will consider shoulder x-rays      for further workup if need be to if she has glenohumeral      degenerative joint disease versus hooked acromion.      Erick Colace, M.D.  Electronically Signed     AEK/MedQ  D:  07/16/2008 11:29:36  T:  07/17/2008 01:40:36  Job #:  [id number]   cc:   Kari Baars, M.D.  Fax: [phone]

## 2010-07-07 NOTE — Procedures (Signed)
[name], [name] NO.:  [id number]   MEDICAL RECORD NO.:  [id number]           PATIENT TYPE:   LOCATION:                                 FACILITY:   PHYSICIAN:  Erick Colace, M.D.DATE OF BIRTH:  Jul 01, 1929   DATE OF PROCEDURE:  03/21/2008  DATE OF DISCHARGE:                               OPERATIVE REPORT   PROCEDURE:  This is the right shoulder injection.   INDICATIONS:  Glenohumeral arthritis pain only partially response to  medication management including narcotic analgesics interferes with  daily living activities.  Last shoulder injection was performed on  December 19, 2007.   Informed consent was obtained after describing risks and benefits of the  procedure with the patient.  These include bleeding, bruising, and  infection.  She elects to proceed and has given written consent.  The  patient was placed in a seated position.  Posterior inferior aspect of  the acromion marked, prepped with Betadine, entered with a 25-gauge 1-  1/2 inch needle.  After negative drawback for blood,  a 1 mL of 40 mg/mL  Depo-Medrol plus 4 mL of 1% lidocaine were injected.  The patient  tolerated the procedure well.  Post-injection instructions given.      Erick Colace, M.D.  Electronically Signed     AEK/MEDQ  D:  03/21/2008 16:04:39  T:  03/22/2008 05:27:02  Job:  [id number]

## 2010-07-07 NOTE — Assessment & Plan Note (Signed)
The patient follows up today.  She is here mainly for right shoulder,  but since I last saw her in February 20, 2008, her back is bothering her  more.  It is more on the left side than the right side.  She has had a  good results in the past with lumbar RF performed in June 2009 at the L3-  L4 and L5 levels.  The RF really help the right side of the back.  The  left side of the back really was not help.  Her main pain is in left  side of the back.  She has had decent relief with some right-sided SI  injections, has never tried left side.   Her pain is only partial response to medication management.  We did  decrease her Percocet from 60 tablets per month to 50, however, she  thinks that she really could not go any further down on the number   Pain is scored as 6-8/10.  Activity interference is 7/10.  Sleep is  fair.  Relief from meds is good.  She walks 5 minutes at a time.  She  does not climb steps.  She does use a walker to ambulate.  She needs  some help with dressing, bathing, household duties, shopping.   PAST HISTORY:  Kidney disease stage III, sees Dr. Briant Cedar.  She has  hypothyroidism, diabetes, and hypertension.   She is married, lives with her husband.  Her daughter does her driving  for her.   PHYSICAL EXAM:  VITAL SIGNS:  Blood pressure re 171/61, pulse 64,  respirations 18, and O2 sat 92% room air.  GENERAL:  Obese, elderly female, in no acute stress.  Orientation x3.  Affect alert.  Gait is with a walker.  No toe drag or knee instability.  EXTREMITIES:  Without edema.  Coordination is normal, but she was walks  slowly.  Her back has tenderness at palpation left PSIS primarily.   IMPRESSION:  1. Left-sided lumbar pain, question whether this is sacroiliac versus      lumbar facet.  We will schedule for sacroiliac injection.  2. Right shoulder pain, scheduled already for glenohumeral injection.      Erick Colace, M.D.  Electronically Signed     AEK/MedQ  D:  03/21/2008 16:09:39  T:  03/22/2008 05:08:20  Job #:  [id number]

## 2010-07-07 NOTE — Procedures (Signed)
[name], Carrie [name] NO.:  [id number]   MEDICAL RECORD NO.:  [id number]          PATIENT TYPE:  REC   LOCATION:  [facility]                          FACILITY:  [facility]   PHYSICIAN:  Erick Colace, M.D.DATE OF BIRTH:  February 13, 1930   DATE OF PROCEDURE:  DATE OF DISCHARGE:                               OPERATIVE REPORT   PROCEDURE:  Left L5 dorsal ramus radiofrequency neurotomy, left L4;  medial branch radiofrequency neurotomy, left L3; medial branch  radiofrequency neurotomy.   INDICATIONS:  Lumbar spondylosis without myelopathy with axial back  pain, only partially relieved by medication management and interfering  with self-care and mobility.  She has had good relief with corresponding  levels performed on the right side 1 month ago.  She returns for left-  sided procedure today.   Informed consent obtained after describing risks and benefits of the  procedure with the patient.  These include bleeding, bruising,  infection, and temporary or permanent paralysis.  She elects to proceed  and has given written consent.  The patient was placed prone on  fluoroscopy table.  Betadine prep and sterile drape.  A 25-gauge 1.5-  inch needle was used to anesthetize skin and subcu tissue with 1%  lidocaine x2 mL.  A 20-gauge 10-cm RF needle with 10-mm curved active  tip was inserted under fluoroscopic guidance, first targeting left S1,  SAP, sacral ala junction.  Bone contact was made and confirmed with  lateral imaging.  Sensory stem at 50 Hz followed by motor stem at 2 Hz  confirmed proper needle location followed by injection of 1 mL of  solution containing 1 mL of 4 mg/mL dexamethasone and 2 mL of 1% MPF  lidocaine followed by radiofrequency lesioning at 70 degrees Celsius for  70 seconds.  Then, the left L5 SAP transverse process junction targeted.  Bone contact was made and confirmed with lateral imaging.  Sensory stem  at 50 Hz followed by motor stem at 2 Hz confirmed  proper needle location  followed by injection of 1 mL of the dexamethasone-lidocaine solution  and radiofrequency lesioning at 70 degrees Celsius for 70 seconds and  then the left L4 SAP transverse process junction targeted.  Bone contact  was made and confirmed with lateral imaging.  Sensory stem at 50 Hz  followed by motor stem at 2 Hz confirmed proper needle location followed  by injection of 1 mL of dexamethasone-lidocaine solution followed by  radiofrequency lesioning at 70 degrees Celsius x70 seconds.  The patient  tolerated the procedure well.  Pre and postinjection, vitals stable.  Postinjection instructions were given.  She will return in 1 month in  followup.  The patient tolerated the procedure well.      Erick Colace, M.D.  Electronically Signed     AEK/MEDQ  D:  09/07/2007 10:32:00  T:  09/07/2007 23:42:32  Job:  [id number]

## 2010-07-07 NOTE — Procedures (Signed)
NAMEMIROSLAVA, SANTELLAN NO.:  [id number]   MEDICAL RECORD NO.:  [id number]          PATIENT TYPE:  REC   LOCATION:  [facility]                          FACILITY:  [facility]   PHYSICIAN:  Erick Colace, M.D.DATE OF BIRTH:  1929-07-29   DATE OF PROCEDURE:  03/09/2007  DATE OF DISCHARGE:                               OPERATIVE REPORT   Ms. Koenig returns today.  The procedure is L2-3 transforaminal lumbar  epidural steroid injection under fluoroscopic guidance.   INDICATION:  Right lower extremity lumbar radiculopathy.  Pain is only  partially responsive to medication management including narcotic  analgesic medications and interferes with dressing bathing, meal prep,  household duties and shopping.   Informed consent was obtained after describing the risks and benefits of  the procedure to the patient.  These include bleeding, bruising,  infection, loss of bladder or bladder function, temporary or permanent  paralysis.  She elected proceed and has given written consent.  The  patient placed prone on fluoroscopy table.  Betadine prep, sterile  drape.  A 25-gauge needle inch and a half needle was used to anesthetize  the subcu tissue with 1% percent lidocaine x2 mL.  Then a 22-gauge 5-  inch spinal needle was inserted under fluoroscopic guidance at the right  L2-3 intervertebral foramen, AP, lateral and oblique images utilized.  Omnipaque 180 showed good epidural spread, followed by injection of 2.5  mL of a solution containing 1.5 mL of 4 mg/mL Depo-Medrol and 3.5 mL of  1% MPF lidocaine.  Then the right L3-4 intervertebral foramen was  attempted, however could not get adequate dye flow into the epidural  space due to extensive osteoarthritis and foraminal stenosis at that  area.  Therefore, no further attempts were made.   The patient tolerated the procedure well.  Pre and post injection vitals  stable.  She will follow up in one month for recheck.  Written  prescriptions for Percocet today.      Erick Colace, M.D.  Electronically Signed     AEK/MEDQ  D:  03/09/2007 13:53:27  T:  03/09/2007 15:27:34  Job:  [id number]

## 2010-07-07 NOTE — Assessment & Plan Note (Signed)
Carrie [name] follows up today.  She last saw me 1 month ago.  She has  right shoulder pain due to osteoarthritis, had improvement with right  shoulder injection, this continues.  She is still doing well in terms of  her back.  When I last saw her on December 19, 2007, we had prescribed 60  oxycodone, and on her pill count today, she still has 38 left.  She will  take 2 on rainy days but only 1 on regular days.   She has a 5 to 6 out of 10 pain but only interferes with activity at a  moderate level.  She could walk 5 minutes at a time.  She uses a walker.  She has had no falls while using a walker.  She does not climb steps or  drive.  She needs help with bathing, meal prep, and household duties.   REVIEW OF SYSTEMS:  Positive for night sweats, limb swelling, and sleep  problems.   SOCIAL HISTORY:  Married, lives with her husband.  Her daughter helps  get her to the doctor's office.   Her Oswestry Disability Index today is 44%.   Her back has some tenderness at bilateral PSIS.  She has positive  impingement, right shoulder; actually range of motion is reduced to less  than 90 of abduction but really no pain with it.   Her gait shows no evidence of toe drag or knee instability.  Lower  extremity strength is normal.   IMPRESSION:  1. Sacroiliac disorder, improved after right sacroiliac injection 2      months ago.  2. Right shoulder osteoarthritis, improved after injection 1 month      ago.   PLAN:  We will go ahead and see her back in 1 month.  I have not written  a new prescription for oxycodone; in fact, she is doing well with her  previous prescription, and if she is still doing well next month, we  will be able to cut down her number of pills per month from 60 to 45.      Erick Colace, M.D.  Electronically Signed     AEK/MedQ  D:  01/16/2008 09:11:04  T:  01/16/2008 22:21:29  Job #:  [id number]

## 2010-07-07 NOTE — Procedures (Signed)
[name], [name] NO.:  [id number]   MEDICAL RECORD NO.:  [id number]          PATIENT TYPE:  REC   LOCATION:  [facility]                          FACILITY:  [facility]   PHYSICIAN:  Erick Colace, M.D.DATE OF BIRTH:  1929-05-30   DATE OF PROCEDURE:  DATE OF DISCHARGE:                               OPERATIVE REPORT   PROCEDURE:  This is a right subacromial bursa injection.   INDICATION:  Right subacromial bursitis with pain only partially  responsive to medication management with other conservative care and  interfering with her sleep.   Informed consent was obtained after describing the risks and benefits of  the procedure with the patient.  These include bleeding, bruising, and  infection.  She elects to proceed and has given written consent.  The  patient was placed in seated position.  Posterolateral approach  utilized.  Area marked, prepped with Betadine and alcohol, entered with  25-gauge 1-1/2-inch needle, turned to three-quarter inch depth.  Then, 1  mL of 40 mg/mL Depo-Medrol and 4 mL of 1% lidocaine was injected.  The  patient tolerated the procedure well.      Erick Colace, M.D.  Electronically Signed     AEK/MEDQ  D:  07/16/2008 11:26:40  T:  07/17/2008 02:36:59  Job:  [id number]

## 2010-07-07 NOTE — Assessment & Plan Note (Signed)
Patient is a 77 year old female returns today.  I last saw her May 19, 2007.  She has a history of lumbar facet syndrome.  She has had  improvement in her lumbar facet axial pain status post radiofrequency  neurotomy on the right side in October of 2008, on the left side in  November of 2008.  She feels like it is starting to wear off.  The right  thigh pain has been improved since the L2-3 transforaminal injection on  the right performed March 09, 2007.   She has had some problems in terms of her heart.  She has been told she  has what sounds like mitral valve regurgitation or stenosis.  She is  scheduled to undergo echocardiogram.   She has had a change in medications where her Hyzaar was discontinued  but started on Lasix b.i.d.   Her current pain meds include:  1. Oxycodone 5/500 or 5/325 b.i.d.  2. Limbrel 250 mg p.o. b.i.d.  3. Lidoderm patch on 12, off 12.   Her pain interference scores are 5 with activity, 5 with relationship  with others, enjoying life is 7.  Pain is worse during morning and  nighttime hours described as aching pain.  Sleep is poor.  Pain location  is in the low back, mid back as well as bilateral knees.  Relief from  meds is good.  Her functional review of systems needs some assistance  with dressing, bathing, meal prep, household duties, and shopping.   REVIEW OF SYSTEMS:  Positive for limb swelling.   SOCIAL HISTORY:  Married and lives with her husband.  Her daughter does  accompany her on doctor's visits.   Her blood pressure is 139/56, pulse 46, O2 sat 94% on room air.  GENERAL:  Obese female in no acute distress, orientation x3.  She ambulates with a walker.  She has poor balance, full reflex posture.  She has 5/5 strength bilateral hip flexion, knee extension, ankle  dorsiflexion.  Deep tendon reflexes are 1+ bilateral knees and ankles.  Extremities have trace pretibial edema.  Range of motion is good in the  hips, knees and ankles.  Her  back has some tenderness to palpation in the mid thoracic as well as  lumbar area, more on the right side than the left side.   IMPRESSION:  1. Lumbar facet syndrome.  Radiofrequency neurotomy seems to be      wearing out.  We will repeat on the right side next month which      will be approximately 9 to 10 months post.  2. Right L2-3 radiculopathy improved status post epidural injection.   PLAN:  1. Continue Limbrel 250 b.i.d.  2. Continue Percocet 5/325 b.i.d.  3. Lidoderm patch on 12, off 12.      Erick Colace, M.D.  Electronically Signed     AEK/MedQ  D:  07/11/2007 09:18:29  T:  07/11/2007 10:04:32  Job #:  [id number]   cc:   Sherryll Burger, Dr.   Ty Hilts, Dr.

## 2010-07-07 NOTE — Procedures (Signed)
NAMETRENACE, COUGHLIN NO.:  [id number]   MEDICAL RECORD NO.:  [id number]          PATIENT TYPE:  REC   LOCATION:  [facility]                          FACILITY:  [facility]   PHYSICIAN:  Erick Colace, M.D.DATE OF BIRTH:  11/30/1929   DATE OF PROCEDURE:  DATE OF DISCHARGE:                               OPERATIVE REPORT   PROCEDURE:  Bilateral L5 dorsal ramus injection, bilateral L4 medial  branch block, bilateral L3 medial branch block under fluoroscopic  guidance.   INDICATION:  Lumbar facet syndrome with pain only partially responsive  to medication management including narcotic analgesics.   Informed consent was obtained after describing risks and benefits of the  procedure to the patient; these include bleeding, bruising, infection,  loss of bowel or bladder function, temporary or permanent paralysis; she  elects to proceed and has given written consent.   DESCRIPTION OF PROCEDURE:  The patient was placed prone on the  fluoroscopy table with Betadine prep and sterile drape.  A 25-gauge inch-  and-a-half needle was used to anesthetize the skin and subcu tissue with  1% lidocaine x2 mL, then a 22-gauge 3-1/2-inch spinal needle was  inserted, first starting in the left S1 SAP-sacral ala junction, bone  contact made, confirmed with lateral imaging.  Omnipaque 180 x 0.5 mL  demonstrated no intravascular uptake, then 0.5 mL of a solution  containing 1 mL of 4 mg/mL of dexamethasone and 2 mL of 2% MPF lidocaine  were injected.  Then the left L5 SAP-transverse junction was targeted,  bone contact made, confirmed with lateral imaging.  Omnipaque 180 x 0.5  mL demonstrated no intravascular, then 0.5 mL of dexamethasone/lidocaine  solution was injected and the left L4 SAP-transverse junction targeted,  bone contact made, confirmed with lateral imaging.  Omnipaque 180 x 0.5  mL demonstrated no intravascular uptake, then 0.5 mL of  dexamethasone/lidocaine solution was  injected.  This same procedure was  repeated on the right side using the same injectate, the same technique  and equipment.  The patient tolerated procedure well.  Pre and post  injection vitals were stable.  Pre injection pain level 8/10, post  injection 4/10 to 5/10.  We will repeat in 1 month.  Post-injection  instructions given.  The patient tolerated the procedure well.      Erick Colace, M.D.  Electronically Signed    AEK/MEDQ  D:  10/17/2006 13:10:40  T:  10/18/2006 07:57:23  Job:  [id number]   cc:   Kari Baars, M.D.  Fax: [phone]   Vanita Panda. Magnus Ivan, M.D.  Fax: [phone]

## 2010-07-07 NOTE — Group Therapy Note (Signed)
PAIN MANAGEMENT CONSULTATION.   CONSULT REQUESTED BY:  Vanita Panda. Magnus Ivan, M.D.   REASON FOR CONSULTATION:  Back pain and right shoulder pain and  bilateral knee pain.  Pain is greater than 8/10 with activity and  described as aching.  Improved with heat, medication, TENS unit.  Worse  with standing and walking.  She uses a cane or a walker.  She does not  drive nor climb steps anymore.  She uses a wheelchair.  She transfers  well.  Her functional status requires assisted bathing, dressing, meal  prep, household duties.   REVIEW OF SYSTEMS:  Positive for numbness or tingling and trouble  walking, sleep problems and blood sugar problems.   PAST MEDICAL HISTORY:  Significant for diabetes and has had some  problems with increased CBG's this morning.  She has history of  hypothyroidism as well as hypertension and depression.   PAST SURGICAL HISTORY:  Positive for right knee replacement as well as  gallbladder surgery.   Diagnostic tests done for her low back problems include MRI of the  lumbar spine dated 07/14/05.  She had inferior end plate compression L4  with minimal loss of height of the vertebral body.  She had a broad-  based right posterolateral disk herniation L2-3, possibly compressing  right L3 nerve root.  At L3-4 she has a broad-based posterolateral disk  primarily affecting right lateral recess compressing right L4 nerve  root.  She has facet arthropathy at L4-5.   PREVIOUS TREATMENTS:  She had most recently L4-5 facet injections, per  radiology, which helped, but she had problems with elevated blood  sugars.  It should be noted that 120 mg of Depo-Medrol were injected.  This was performed 07/24/06.  In addition, she had lumbar epidural  injection L4-5 left paramedian and at the same time a left sacroiliac  injection was done.  This was not as effective and, once again, elevated  blood sugars, total mg dose Depo-Medrol 120 with that procedure.   VITAL SIGNS:   Blood pressure 155/74, pulse 72, respiratory rate 18, O2  saturation 93% on room air.   PHYSICAL EXAMINATION:  Elderly female in no acute distress.  Weight is  220 pounds.  Height is 5 foot 6.  Her range of motion is limited right  shoulder with abduction of 90 degrees and decreased internal and  external rotation.  Left upper extremity has full range of motion.  Her  cervical range of motion is decreased with lateral bending and rotation  50% to the right and 75% to the left, flexion and extension 75%.  Hip  range of motion is approximately 50% range.  Internal and external  rotation and range of motion is 0 to 90 degrees.  Ankle range of motion  is full.  She has surgical scar with her right knee.  Her sensory  testing is normal in the upper extremities with decreased right L4  dermatome.  Palpation of the hips reveals right greater than left  trochanteric bursa.  I did palpate the fibromyalgia tender points and  only 5 of the 18 were positive.   IMPRESSION:  1. Chronic low back pain, lumbar spondylosis with facet arthropathy.      Given her pain is mainly axial, I would expect the facet      arthropathy is the major contributor to her low back pain.  2. Right L4 radiculitis.  This appears to be more intermittent and not      a major complaint  at this point.   PLAN:  I discussed with both the patient and the daughter using spine  models I think she would benefit from medial branch blocks to denervate  the L4-5 and L5-S1 facet joints and assess her pain reduction.  As I  noted, 50% would be considered significant.  In addition, I will  prescribe her oxycodone 5 mg p.o. b.i.d.  I have given her some samples  of Limbrel 250 mg p.o. b.i.d. to be used in place of her current  nonsteroidal, which is ibuprofen.   I will see her back for the injection.   Thank you very much for this interesting consultation.      Erick Colace, M.D.  Electronically Signed     AEK/MedQ  D:   09/16/2006 17:31:45  T:  09/17/2006 15:52:26  Job #:  [id number]   cc:   Kari Baars, M.D.  Fax: [phone]

## 2010-07-07 NOTE — Procedures (Signed)
Carrie [name], KOPPEL NO.:  [id number]   MEDICAL RECORD NO.:  [id number]          PATIENT TYPE:  REC   LOCATION:  [facility]                          FACILITY:  [facility]   PHYSICIAN:  Erick Colace, M.D.DATE OF BIRTH:  [birth year]   DATE OF PROCEDURE:  10/17/2006  DATE OF DISCHARGE:                               OPERATIVE REPORT   PROCEDURE:  Bilateral L5 dorsal ramus injection, bilateral L4 medial  branch block, bilateral L3 medial branch block, under fluoroscopic  guidance.   INDICATIONS:  Lumbar facet mediated pain only partially relieved by  medications.   Pain is causing reduction in ability to bathe and dress as well as  perform meal prep, household duties.  Only partially relieved by  medication management including narcotic analgesics.   Informed consent was obtained after describing risks and benefits to the  patient.  These include bleeding, bruising, infection, loss of bladder  function, temporary permanent paralysis, elects to proceed and has given  written consent.  The patient also had daughter with here go over the  forms.   DESCRIPTION OF PROCEDURE:  The patient placed prone on fluoroscopy  table.  Betadine prep, sterile drape. 25-gauge inch and half needle was  used to anesthetize skin and subcu tissue, 1% lidocaine x2 mL. A 22  gauge 3-1/2 inch spinal needle inserted under fluoroscopic guidance.  First starting the left S1 SAP sacral ala junction bone contact made  confirmed with lateral imaging.  Omnipaque 180 x 0.5 mL demonstrated no  intravascular uptake, containing 0.5 mL of a solution containing 1 mL of  4 mg/mL dexamethasone and 2 mL of 2% lidocaine was injected.  Next, left  L5 SAP transverse junction targeted bone contact made confirmed with  lateral imaging.  Omnipaque 180 x 0.5 mL demonstrated no intravascular  uptake and 0.5 mL of dexamethasone lidocaine solution injected.  Next  the left L4 SAP transverse junction targeted bone  contact made confirmed  with lateral imaging and Omnipaque 180 x 0.5 mL demonstrated no  intravascular uptake and 0.5 mL of dexamethasone lidocaine solution  injected.  The patient tolerated procedure well.  Pre injection pain  level 8/10 and post injection 4/10.  She was scheduled for repeat medial  branch blocks in 3-4 weeks.   The patient also requests a prescription for a rolling walker with front  wheels only. She will get this today.   Post injection instructions given.  Pre and post injection vitals  stable.      Erick Colace, M.D.  Electronically Signed     AEK/MEDQ  D:  10/17/2006 11:12:04  T:  10/18/2006 00:36:05  Job:  [id number]

## 2010-07-07 NOTE — Procedures (Signed)
Carrie, [name] NO.:  [id number]   MEDICAL RECORD NO.:  [id number]           PATIENT TYPE:   LOCATION:                                 FACILITY:   PHYSICIAN:  Erick Colace, M.D.DATE OF BIRTH:  01-27-1930   DATE OF PROCEDURE:  08/10/2007  DATE OF DISCHARGE:                               OPERATIVE REPORT   PROCEDURE:  Right L5 dorsal ramus radiofrequency neurotomy, right L4  medial branch radiofrequency neurotomy, and right L3 medial branch  radiofrequency neurotomy under sacral and lumbar fluoroscopic guidance.   INDICATION:  Lumbar facet-mediated pain, has had good results with prior  identical procedure, last performed in September, started wearing off  around May.   The pain does interfere with self-care mobility and is only partially  responsive to medications, is rated as severe during activity and  moderate during rest.   PROCEDURE IN DETAIL:  Informed consent was obtained after describing  risk and benefits of the procedure with the patient.  These include  bleeding, bruising, and infection.  She elected to proceed and has given  written consent.   The patient was placed prone on fluoroscopy table.  Betadine prep,  sterile drape.  A 25-gauge 1-1/2-inch needle was used to anesthetize the  skin and subcu tissue with 1% lidocaine x2 mL.  Then, a 20-gauge 15-cm  RF needle with a 10-mm straight active tip was inserted under  fluoroscopic guidance first starting at the right S1 SAP-sacral iliac  junction.  Bone contact made, confirmed with lateral imaging.  Sensory  stimulation at 50 Hz followed by motor stimulation at 2 Hz confirmed  proper needle location followed by injection of solution containing 1 mL  of 4 mg/mL dexamethasone with 2 mL of 1% MPF lidocaine followed by  radiofrequency lesioning at 70 degrees Celsius x70 seconds in the right  L5 SAP-transverse-process junction target.  Bone contact made, confirmed  with lateral imaging.   Sensory stimulation at 50 Hz followed by motor  stimulation at 2 Hz confirmed proper needle location, followed by  injection of 1 mL of dexamethasone-lidocaine solution and radiofrequency  lesioning at 70 degrees Celsius x70 seconds in the right L4 SAP-  transverse-process junction target.  Bone contact made, confirmed with  lateral imaging.  Sensory stimulation at 50 Hz followed by motor  stimulation at 2 Hz confirmed proper needle location, followed by  injection of 1 mL of dexamethasone-lidocaine solution and radiofrequency  lesioning at 70 degrees Celsius for 70 seconds.  The patient tolerated  the procedure well.  Pre- and post-injection vitals stable.  Pre-  injection resting pain level 4/10.  Post-injection resting pain level  0/10.  I will see her back in 1 month for the left side repeat RF.      Erick Colace, M.D.  Electronically Signed     AEK/MEDQ  D:  08/10/2007 11:12:32  T:  08/10/2007 23:55:49  Job:  [id number]

## 2010-07-07 NOTE — Procedures (Signed)
Carrie [name], CROCKET NO.:  [id number]   MEDICAL RECORD NO.:  [id number]          PATIENT TYPE:  REC   LOCATION:  TPC                          FACILITY:  [facility]   PHYSICIAN:  Erick Colace, M.D.DATE OF BIRTH:  1929-06-18   DATE OF PROCEDURE:  05/16/2008  DATE OF DISCHARGE:                               OPERATIVE REPORT   This is a right L5 dorsal ramus radiofrequency neurotomy, right L4  medial branch radiofrequency neurotomy, right L3 medial branch  radiofrequency neurotomy under fluoroscopic guidance.   INDICATIONS:  Lumbar spondylosis without myelopathy.  She has had good  relief with prior radiofrequency procedure at these corresponding levels  which persisted beyond 6 months.  She has pain that is only partially  responsive to medication management and interferes with meal prep,  household duties, shopping, bathing, and dressing.   Informed consent was obtained after describing risks and benefits of the  procedure with the patient.  These include bleeding, bruising, and  infection.  She elected to proceed and has given written consent.  The  patient placed prone on fluoroscopy table.  Betadine prep, sterile  drape.  A 25-gauge 1-1/2-inches needle was used to anesthetize the skin  and subcu tissue, 1% lidocaine x2 mL each at three sites.  Then, a 20-  gauge 10-cm RF needle with 10-mm curved active tip was inserted under  fluoroscopic guidance targeting the right S1 SAP sacral ala junction.  Bone contact made, confirmed with lateral imaging.  Sensory stim at 50  Hz followed by motor stim at 2 Hz confirmed proper needle location  followed by injection of 1 mL of solution containing 1 mL of 4 mg/mL of  dexamethasone, 2 mL of 1% MPF lidocaine followed by radiofrequency  lesioning at 70 degrees Celsius for 70 seconds.  Then, the right L5 SAP  transverse process junction targeted.  Bone bone contact made, confirmed  with lateral imaging.  Sensory stim at 50  Hz followed by motor stim at 2  Hz confirmed proper needle location followed by injection of 1 mL of  dexamethasone and lidocaine solution and radiofrequency lesioning at 70  degrees Celsius for 70 seconds.  The right L4 SAP transverse junction  was targeted.  Bone contact made, confirmed with lateral imaging.  Sensory stim at 50 Hz followed by motor stim at 2 Hz confirmed proper  needle location followed by injection of 1 mL of dexamethasone and  lidocaine solution and radiofrequency lesioning at 70 degrees Celsius  for 70 seconds.  The patient tolerated procedure well.  Pre- and post-  injection vitals stable.  Post-injection instructions given.      Erick Colace, M.D.  Electronically Signed     AEK/MEDQ  D:  05/16/2008 10:14:16  T:  05/16/2008 16:10:96  Job:  [id number]

## 2010-07-07 NOTE — Assessment & Plan Note (Signed)
A 77 year old female who I saw on Jul 17, 2008.  She had right  subacromial bursa injection and has done quite well with this, really  does not think that it has recurred yet.  Her back pain is still doing  fairly well with radiofrequency procedure.  She has been able to  minimize her medication usage, rarely takes about 1 day of her  hydrocodone.   No new medical problems in the interval time.  Pain interference score  is 3-4/10.  Pain improves with rest, heat, pacing activities, and  medications.  She needs assist with dressing, bathing, meal prep,  household duties and shopping.  She has problems with shortness of  breath, sleep apnea, and limb swelling.  She follows with Dr. Clelia Croft from  primary care.   PAST MEDICAL HISTORY:  Significant for heart disease, thyroid, high  blood pressure, arthritis, diabetes.   Blood pressure 189/54, referred to Dr. Clelia Croft for followup on this, pulse  59, respirations 18, and O2 sat 94% on room air.  Elderly female in no  acute stress.  Orientation x3.  Affect is alert.  Gait is with a walker.  She has mild tenderness in the lumbar paraspinals mainly with extension  greater than with flexion.  Shoulders without impingement sign.  Good  strength in the upper extremity.  Range of motion only mildly reduced  internal and external rotation.   IMPRESSION:  1. Lumbosacral spondylosis without myelopathy.  Good response to      radiofrequency procedure.  2. Subacromial bursitis resolved, right side.   PLAN:  I will see her back in about 2-3 months.  Should she have  recurrence of her shoulder pain, we can reinject prior to that time and  should she have recurrence of her back pain, we can repeat  radiofrequency procedure given that it has been at least 3 months for  the right side, but only 2 months for the left side.  Handicap sticker  form filled out for patient.      Erick Colace, M.D.  Electronically Signed     AEK/MedQ  D:  08/22/2008  15:20:14  T:  08/23/2008 05:54:34  Job #:  [id number]   cc:   Kari Baars, M.D.  Fax: [phone]

## 2010-07-10 NOTE — Discharge Summary (Signed)
NAMEAVEREY, KONING NO.:  [id number]   MEDICAL RECORD NO.:  [id number]          PATIENT TYPE:  INP   LOCATION:  3029                         FACILITY:  [facility]   PHYSICIAN:  Jackie Plum, M.D.DATE OF BIRTH:  [birth year]   DATE OF ADMISSION:  08/06/2005  DATE OF DISCHARGE:                                 DISCHARGE SUMMARY   INTERIM DISCHARGE SUMMARY   DATE OF DISCHARGE:  This is an interim discharge summary pending discharge  to rehabilitation service, depending upon bed availability.  Anticipated  date of discharge is some time this week.   DIAGNOSES:  1.  Proteus mirabilis urosepsis, resolved.  2.  Dehydration, resolved.  3.  Chronic back pain.  4.  Deconditioning.  5.  History of diabetes.  6.  Coronary artery disease.  7.  Mitral valve prolapse.  8.  Dyslipidemia.  9.  Hypertension.   CONSULTATIONS:  Not applicable.   PROCEDURE:  Not applicable.   FINAL CONDITION ON DISCHARGE:  To be dictated as an addendum on date of  discharge.   MEDICATIONS:  1.  Lovenox 40 mg subcutaneous injection daily.  2.  Lantus 75 units subcutaneous injection q.h.s.  3.  Synthroid 100 mcg daily.  4.  Lexapro 20 mg daily.  5.  Cardizem CD 360 mg daily.  6.  Cipro 250 mg p.o. b.i.d. to be completed this week.  7.  Tylox one tablet 5/325 mg daily.  8.  Losartan 100 mg daily.  9.  HCTZ 25 mg daily.  10. Ambien 5 mg p.o. q.h.s. p.r.n.  11. Morphine sulfate 1 to 2 mg intravenously q.4h. p.r.n.  12. Zofran 4 mg intravenously q.6h. p.r.n.   REASON FOR ADMISSION:  Urosepsis.  The patient is a 77 year old lady who had  been managed as an outpatient by her primary care physician for chronic back  pain that continued to worsen.  Apparently she had an MRI done which showed  bulging disc.  She presented to dysuria, nausea and vomiting.  According  to H and P by Dr. Tresa Endo on presentation, the patient was afebrile with  temperature of 97.70F.  Her urinalysis was positive  for pyuria and she had  lipid, BUN and __________  with clinical dehydration.  She was therefore  admitted for further evaluation and management.  Please see full details  regarding the patient presentation by reviewing the H and P dictated by Dr.  Tresa Endo on August 06, 2005.   HOSPITAL COURSE:  On admission, the patient was started on IV fluid  supplementation with careful monitoring to prevent iatrogenic fluid overload  and her renal function was also monitored.  She was started on intravenous  antibiotics.  Cultures were done and urine culture came back positive for  Proteus mirabilis which was sensitive to her antibiotic regimen that she had  been on.  Her back pain was also treated with  analgesics.  The patient was  evaluated by both physical therapy and occupational therapy and it was felt  that the patient would need continued rehabilitation and she had requested  that she be admitted to in house  rehabilitation service for further  treatment.  __________  had been done and the patient will be discharged to  rehabilitation services pending bed availability, some time this week.  On  rounds today, the patient denies any nausea and vomiting. She has been  eating and drinking well.  She is not clinically dehydrated.  She has not  had any headaches, visual changes.  Pulmonary  is unremarkable.  Mucous  membranes are moist.  Neck is supple with no JVD.  Her blood pressure was  elevated at 180/88 earlier for which she was given Clonidine and patient was  started back on her Hyzaar on her home dosage.  Apparently this has not  really taken effect at this moment.  The plan is to monitor her blood  pressures with manual blood pressure checks and optimize her blood pressure  control and see if there is a consistent need for increasing the dose of her  antihypertensive.  The patient is not in acute distress.  She is alert and  appropriate today. There are no acute focal deficits on  neurological  testing.  She will be seen by my colleague, Dr. Rito Ehrlich, tomorrow and  discharged at the time a bed becomes available.      Jackie Plum, M.D.  Electronically Signed     GO/MEDQ  D:  08/12/2005  T:  08/12/2005  Job:  [id number]

## 2010-07-10 NOTE — H&P (Signed)
NAMEGRANT, HENKES NO.:  [id number]   MEDICAL RECORD NO.:  [id number]          PATIENT TYPE:  EMS   LOCATION:  ED                           FACILITY[facility]   PHYSICIAN:  Burnard Bunting, M.D.    DATE OF BIRTH:  1929/06/11   DATE OF ADMISSION:  01/09/2004  DATE OF DISCHARGE:                                HISTORY & PHYSICAL   CHIEF COMPLAINT:  Right shoulder pain.   HISTORY OF PRESENT ILLNESS:  Carrie [name] is a 77 year old female who was  walking today when she tripped on the curb and fell on her right shoulder  and nose. She reports nose pain as well as right arm pain, she denies any  loss of consciousness, she denies any other orthopedic complaints.  She is  week on her feet and is unable to ambulate without significant assists.   PAST MEDICAL HISTORY:  Notable for total knee replacement, mitral valve  prolapse, diabetes mellitus, coronary artery disease, status post stent  placement, hypertension and high cholesterol.   FAMILY HISTORY:  Unremarkable. The patient lives near [place], she has a  husband who has early Alzheimer's.   ALLERGIES:  CODEINE, PENICILLIN, VICODIN.   CURRENT MEDICATIONS:  Enalapril, Hyzaar, Synthroid, Fosamax, Lasix,  diazepam, insulin, Tri-Chlor.   REVIEW OF SYMPTOMS:  She has no fevers, chills. Chest no shortness of  breath. Other systems are reviewed and are unremarkable.   PHYSICAL EXAMINATION:  VITAL SIGNS:  She has 90% sat on room air, 98 oral  temperature, blood pressure is 181/71, heart rate is 58, respirations 20.  HEENT:  She does have a nose abrasion but in general the alignment of the  nose is intact.  NECK:  She has no neck pain with range of motion.  CHEST:  Clear to auscultation.  HEART: Regular rate and rhythm.  ABDOMEN:  Benign.  EXTREMITIES:  Demonstrate right arm swelling but 5/5 grip. __________  extension, strength is intact.  Radial pulses 2+ out of 4 and there is no  paresthesias in the hand. The  left upper extremity and bilateral lower  extremities have full range of motion in the ankle, knee and hip joints as  well as the wrist, elbow and shoulder joints. Pedal pulses are palpable.  There is groin pain on internal and external rotation. The extensor  mechanism is intact bilaterally. No effusion in either knee.   Radiographs demonstrate a right proximal humerus fracture three part, the x-  ray is unremarkable.   IMPRESSION:  Right proximal humerus fracture in a patient with a lot of  medical comorbidities.   PLAN:  Admission for pain control, placement and physical therapy.  She may  be in the hospital 1-2 days.  Plan for the fracture is to place her in a  sling and to give this three weeks to heal and then begin range of motion  exercises.  If the fracture does not heal, she will be a candidate for  partial shoulder replacement.  The patient understands the plan and will  proceed accordingly.      GSD/MEDQ  D:  01/09/2004  T:  01/10/2004  Job:  [id number]

## 2010-07-10 NOTE — Discharge Summary (Signed)
NAMEMARIPAT, [name]:  [id number]   MEDICAL RECORD NO.:  [id number]          PATIENT TYPE:  INP   LOCATION:  3029                         FACILITY:  [facility]   PHYSICIAN:  Melissa L. Ladona Ridgel, MD  DATE OF BIRTH:  02-04-1930   DATE OF ADMISSION:  08/06/2005  DATE OF DISCHARGE:  08/13/2005                                 DISCHARGE SUMMARY   ADDENDUM:  Please see the previously dictated discharge summary by Dr.  Jackie Plum and note the following.   On the day of discharge, the patient was found to be acceptable for  rehabilitation care, Dr. Virginia Rochester was contacted and gave approval for  her to transfer to the rehab floor. The patient was clinically stable for  discharge but was not seen and evaluated on the day of discharge by general  medicine as she awaited placement. The final condition at the time of  discharge was stable. No further changes were made to her care in the 24  hours between determination of her stability for transfer discharge and the  time of actual moving to the fourth floor.   Again, please see the discharge summary dictated by Dr. Greggory Stallion Osei-Bonsu.      Melissa L. Ladona Ridgel, MD  Electronically Signed     MLT/MEDQ  D:  08/14/2005  T:  08/14/2005  Job:  [id number]

## 2010-07-10 NOTE — Discharge Summary (Signed)
NAMEMAYCE, [name] NO.:  [id number]   MEDICAL RECORD NO.:  [id number]          PATIENT TYPE:  IPS   LOCATION:  4005                         FACILITY:  [facility]   PHYSICIAN:  Erick Colace, M.D.DATE OF BIRTH:  [birth year]   DATE OF ADMISSION:  08/13/2005  DATE OF DISCHARGE:  08/21/2005                                 DISCHARGE SUMMARY   DISCHARGE DIAGNOSES:  1.  Lumbar spondylosis.  2.  Proteus urinary tract infection, treated.  3.  Hypertension.  4.  Depression.   HISTORY OF PRESENT ILLNESS:  Carrie [name] is a 77 year old female with  history of hypertension, DJD, DVT, admitted August 05, 2005, with 3-day  history of hesitancy, UTI and dehydration, treated with IV antibiotics and  IV fluids.  Continues on p.o. Cipro for Proteus UTI.  The patient noted to  be deconditioned with complaints of low back pain and decreased mobility 6  weeks prior to admission.  The patient had consult for further therapies.   PAST MEDICAL HISTORY:  Significant for:  1.  DM, type 2.  2.  Coronary artery disease.  3.  Hypertension.  4.  Right total knee replacement.  5.  Paroxysmal atrial fibrillation.  6.  Cholecystectomy.  7.  Mitral valve prolapse.  8.  Right humerus fracture, December 2006.   ALLERGIES:  PENICILLIN, SULFA, BACITRACIN and VICODIN.   FAMILY HISTORY:  Positive for diabetes and coronary artery disease.   SOCIAL HISTORY:  The patient is married.  Lives in an independent living  facility with husband.  Daughter assists with housework.  The patient does  not use any tobacco or alcohol.   HOSPITAL COURSE:  Carrie [name] was admitted to Rehab on August 14, 2005, for inpatient therapies to consist of PT, OT daily.  Past admission,  pain was managed with OxyIR and lidocaine patches to low back.  Right  shoulder was injected secondary to right shoulder pain and degeneration.  Blood pressures were monitored on b.i.d. basis, and as were noted to be in  upward  trend, Cozaar was resumed.  Labs checked past admission revealed  hemoglobin 11.7, hematocrit 33.4, white count 6.5, platelets 304.  Check of  lytes revealed sodium 138, potassium 4.2, chloride 104, CO2 27, BUN 20,  creatinine 1.1, glucose 221.  The patient's blood sugars were monitored on  a.c., at bedtime basis, and as were noted to be variable, 70/30 insulin was  resumed.  TSH check was noted to be low at 0.167.  UA UC check past  admission showed no growth.  Repeat of August 16, 2005, showed E. coli and  enterococcus greater than 100,000 colonies.  The patient was started on  Cipro for this.  The patient was noted to have a syncopal episode on August 20, 2005, with subsequent fall.  CT of head done showed right frontal soft  tissue swelling, no intracranial finding, atrophy, and small vessel disease.  Check of lytes revealed patient to be slightly dehydrated with BUN at 30.  Orthostatic blood pressure, pulse were checked with the patient's blood  pressures noted to be from  155/64 lying to 145/55 standing.  No complaints  of dizziness reported.  The patient continued with therapies without any  recurrent syncope or orthostasis 24 hours prior to discharge.   The patient was able to participate in therapy and progressed along to being  at supervision level for ADL.  She was at min assist ambulating up to 120  feet with a rolling walker, min to max assist for transfers, depending on  fatigue levels.  Further followup therapies to include home health PT, OT by  [facility] past discharge.  On August 21, 2005, the patient was  discharged to home.   DISCHARGE MEDICATIONS:  1.  Synthroid 100 mcg a day.  2.  Lexapro 20 mg a day.  3.  Cardizem CD 240 mg a day.  4.  Lidocaine patches 2 on at 8 a.m., off at 8 p.m.  5.  OxyIR 5 mg to 10 mg q.4-6 h. p.r.n. for pain.  6.  Fentanyl patch 20 mcg at night, change q. 72 hours.  7.  Hyzaar 100/25 per day.  8.  Insulin 70/30, 24 units in a.m., 20  units in p.m.  9.  Lasix 20 mg a day.  10. Cipro 250 mg b.i.d. for 6 days.   DIET:  Diabetic diet.   ACTIVITY:  As tolerated.  No driving, 4-6 weeks.  Supervision to be  necessary.   FOLLOWUP:  The patient to follow up with LMD for routine check.  Follow up  with Dr. Amil Amen as needed.      Greg Cutter, P.A.      Erick Colace, M.D.  Electronically Signed    PP/MEDQ  D:  08/30/2005  T:  08/30/2005  Job:  [id number]   cc:   Francisca December, M.D.  Fax: [phone]

## 2010-07-10 NOTE — Op Note (Signed)
NAMEMINIYA, MIGUEZ NO.:  [id number]   MEDICAL RECORD NO.:  [id number]          PATIENT TYPE:  OIB   LOCATION:  5703                         FACILITY:  [facility]   PHYSICIAN:  Beulah Gandy. Ashley Royalty, M.D. DATE OF BIRTH:  1929-07-09   DATE OF PROCEDURE:  12/09/2005  DATE OF DISCHARGE:  12/10/2005                                 OPERATIVE REPORT   ADMISSION DIAGNOSIS:  Retained lens material, right eye; capsular remnants,  right eye.   PROCEDURES:  Pars plana vitrectomy, right eye, with 25-gauge system, retinal  photocoagulation, membrane peel, capsulectomy, all in the right eye.   SURGEON:  Alan Mulder, M.D.   ASSISTANT:  Rosalie Doctor, M.A.   ANESTHESIA:  General.   DETAILS:  Usual prep and drape, peritomies at 8, 10 and 2 o'clock.  The 25-  gauge trocars were placed at 8, 10 and 2 o'clock.  The infusion placed at 8  o'clock.  The lighted pick and the cutter were placed at 10 and 2 o'clock  respectively.  The pars plana vitrectomy was begun just behind the  pseudophakos capsular remnants but thrown into folds, and large pieces of  remnants were seen.  These were carefully removed under low suction and  rapid cutting.  Lens material was seen around the intraocular lens.  This  was carefully removed with a 25-gauge tip.  Tip was drawn into the anterior  chamber, and lens material was removed from the anterior chamber as well.  The biome viewing system was moved into place, and the vitrectomy was  carried posteriorly down to the macular surface where lens remnants were  seen.  These were removed with the vitreous cutter.  Posterior hyaloid was  stripped and peeled from its attachments to the retinal surface.  The  vitrectomy was carried out to the periphery where some scleral depression  was used for optimum viewing.  The endolaser was positioned in the eye; 658  burns were placed around the retinal periphery with a power of 1000  milliwatts, 1000 microns each and 1.1  seconds each.  Two rows of laser were  placed.  Additional vitrectomy cutting was performed until the capsule was  extended out to the edge of the implant.  This was removed from the eye, and  the wounds were held until tight.  The conjunctiva was allowed to slide over  the scleral flap.  Polymyxin and gentamicin were irrigated into the tenon  space.  Marcaine was injected around the globe for postoperative pain.  Decadron 10 mg was injected into the lower subconjunctival space.  TobraDex  ophthalmic ointment, a patch and shield were placed.  The closing pressure  was 10 with a ________ keratometer.   COMPLICATIONS:  None.   DURATION:  One hour.     Beulah Gandy. Ashley Royalty, M.D.  Electronically Signed    JDM/MEDQ  D:  12/09/2005  T:  12/10/2005  Job:  [id number]

## 2010-07-10 NOTE — H&P (Signed)
Carrie [name], Carrie [name] NO.:  [id number]   MEDICAL RECORD NO.:  [id number]          PATIENT TYPE:  IPS   LOCATION:  NA                           FACILITY:  [facility]   PHYSICIAN:  Erick Colace, M.D.DATE OF BIRTH:  Oct 31, 1929   DATE OF ADMISSION:  08/13/2005  DATE OF DISCHARGE:                                HISTORY & PHYSICAL   REASON FOR ADMISSION:  Decline in self-care mobility secondary to UTI in a  patient with chronic low back pain.   HISTORY OF PRESENT ILLNESS:  This is a 77 year old female with prior history  of hypertension, DJD and degenerative disc disease admitted on August 05, 2005, with a 3-day history of hesitancy, UTI and dehydration.  She was  started on IV antibiotics and IV fluids.  She continued on Cipro for UTI  treatment.  She has been mobilized with physical therapy, but found to be  deconditioned.  She has had low back pain with decreased mobility.  Six  weeks prior to admission, she had a MRI of the lumbar spine ordered by Dr.  Prince Rome demonstrating end-plate fracture at L4 and a broad-based right,  posterolateral herniation at L2-L3 with migration disc material in right  lateral recess behind L3, broad-based posterolateral disc herniation, right  lateral recess stenosis at L3-L4 and L4-L5, facet hypertrophy, narrowing of  lateral recesses.  There was edema in the inferior part of the L4 body and  this is felt to be acute.   PAST MEDICAL HISTORY:  1.  Type 2 diabetes.  2.  CAD.  3.  Hypertension.  4.  Paroxysmal atrial fibrillation.  5.  Mitral valve prolapse.  6.  Right humeral fracture in December 2006.   PAST SURGICAL HISTORY:  1.  Cholecystectomy.  2.  Right TKR.   FAMILY HISTORY:  Positive DM, positive CAD.   SOCIAL HISTORY:  Married and lives in independent living facility with her  husband who has Alzheimer's.  No tobacco or ETOH.  Daughter can assist with  housework.   MEDICATIONS:  1.  Hyzaar 100/25 daily.  2.  70/30  insulin 35 units b.i.d.  3.  Synthroid 100 mcg daily.  4.  Cardizem 360 mg daily.  5.  Lasix 20 mg p.o. daily.  6.  Lexapro 20 mg p.o. daily.   CURRENT MEDICATIONS:  1.  Insulin sliding scale.  2.  NovoLog with nightly coverage.  3.  Lovenox 40 mg subcu daily.  4.  Lantus insulin 25 units subcu nightly in substitution for 70/30.  5.  NovoLog 4 units subcu t.i.d. with meals.  6.  Levothyroxine 100 mcg p.o. daily.  7.  Lexapro 20 mg p.o. daily.  8.  Cardizem CD 360 mg p.o. daily.  9.  Hydrochlorothiazide 25 mg p.o. daily in substitution for Lasix.  10. Colace 100 mg p.o. b.i.d.  11. Lidoderm patch to low back, on in a.m. and off at 8 p.m.  12. Cipro 250 mg x1 dose, then discontinue.   LABORATORY DATA AND X-RAY FINDINGS:  Hemoglobin A1c of 6.0.  Her last BUN  27, creatinine 1.7.  Sodium  141, potassium 3.9.  Liver function tests with  AST, ALT and Alk phos normal.  White count 7.1, hemoglobin 12.2 and  platelets 228,000 performed on June 15.  She had repeat electrolytes  performed on August 10, 2005, with potassium 4.0, BUN 9, creatinine 1.0.   PHYSICAL EXAMINATION:  VITAL SIGNS: (From June 22) Temperature 97.1, pulse  86, respirations 20, blood pressure 136/72.  CBG 142.  GENERAL:  Obese female in no acute distress.  HEENT:  Eyes anicteric and not injected.  Extraocular movements intact.  ENT  normal.  NECK:  Supple without adenopathy.  LUNGS:  Clear.  HEART:  Regular rate and rhythm.  ABDOMEN:  Positive bowel sounds, soft.  NEUROLOGIC:  Her motor strength 3- at the deltoid given that she has rotator  cuff problems.  She is otherwise 4 at biceps, triceps and grip on the right  side and 4 on the left side.  She has 3- hip flexion bilaterally.  Quadriceps 4 and gastrocnemius 4.  Sensation reduced by lower extremities at  the feet, although she can distinguish which toes touched.  Her mood, memory  and affect are all intact.  Orientation x3.   IMPRESSION:  1.  Lumbar spondylosis,  spinal stenosis as well as L4 compression fracture      causing decreased self-care mobility.  2.  Urinary tract infection which has finished treatment.  3.  Hypertension, monitored on Cozaar, hydrochlorothiazide and Cardizem.  4.  Diabetes type 2.  Monitor blood sugars in the morning and at night.  She      is on a new regimen and hopefully this will control her blood sugars      better.  We will need to monitor this closely.  5.  Depression.  Continue Lexapro.  May need neuropsychology involvement.   PLAN:  Estimated stay is 7-10 day.  The patient is a good rehabilitation  candidate for goals and returning home with modified independent level.      Erick Colace, M.D.  Electronically Signed     AEK/MEDQ  D:  08/13/2005  T:  08/13/2005  Job:  [id number]   cc:   Sherin Quarry, MD   Francisca December, M.D.  Fax: [phone]   Lillia Carmel, M.D.  Fax: [phone]

## 2010-07-10 NOTE — H&P (Signed)
NAMEJOELY, LOSIER NO.:  [id number]   MEDICAL RECORD NO.:  [id number]          PATIENT TYPE:  EMS   LOCATION:  ED                           FACILITY:  [facility]   PHYSICIAN:  Sherin Quarry, MD      DATE OF BIRTH:  1929-04-28   DATE OF ADMISSION:  08/05/2005  DATE OF DISCHARGE:                                HISTORY & PHYSICAL   Carrie [name] is a 77 year old lady uses that about 6 weeks ago she began  to experience increasingly severe back discomfort.  She was sent for an MRI  scan and was told she had a bulging disc.  She has been treated with  oxycodone and Darvocet for the back discomfort.  About 3 days ago, she says  that she began to experience dysuria and a feeling that she was not emptying  her bladder.  This feeling became more and more severe.  Today, she began to  experience nausea and vomiting.  Because of all these complaints, she was  brought to the [facility] Emergency Room.  On arrival, her temperature was  97.3, blood pressure 158/81, pulse 104, O2 saturation 97%.  Urinalysis was  remarkable for 21-50 white cells with many bacteria.  Lab studies showed  creatinine of 2, BUN of 33, possibly consistent with dehydration.  Her blood  sugar is 153.  She is admitted at this time for management of urinary tract  infection associated with dehydration.  While in emergency room, a Foley  catheter was placed.   PAST MEDICAL HISTORY:  Her current medications consist of Hyzaar 100/25 one  daily, 70/30 insulin 35 units twice daily, Synthroid 100 mcg daily, Cardizem  360 mg daily, Lasix 20 mg daily, Lexapro 20 mg daily.  She is allergic to  PENICILLIN, SULFA drugs and BACITRACIN.   PAST OPERATIVE HISTORY:  She has had a total knee replacement.  She is also  had a cardiac catheterization in 2000 and had a balloon procedure as well as  a stent.  She has had a cholecystectomy in the past.   MEDICAL ILLNESSES:  1.  Diabetes:  The patient has a longstanding  history of diabetes which      apparently is very well-controlled.  She monitors her blood sugars      closely and says that there are almost always in the range of 90-120.  2.  Coronary disease:  She is followed by Dr. Amil Amen of [facility]      for her heart problems.  She says that she has had no recent heart      problems in the last several years.  She has not had a stress test done      recently.  3.  Hyperlipidemia.  4.  Hypertension:  The patient states that her blood pressure is well      regulated on the regimen described above.   FAMILY HISTORY:  Noncontributory.   SOCIAL HISTORY:  The patient lives with her husband in an independent living  center.  Her husband has Alzheimer's disease.   REVIEW OF SYSTEMS:  HEAD:  She  denies headache or dizziness.  EYES:  She  denies visual blurring.  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 NOSE AND THROAT:  She  denies earache or sore throat.  CHEST:  Denies coughing, wheezing, chest  congestion or shortness of breath.  CARDIOVASCULAR:  Denies orthopnea, PND  or ankle edema.  GI:  See above.  There has been no hematemesis or melena.  GU:  See above.  There has been no hematuria.  NEURO:  No history of seizure  or stroke.  ENDO:  See above.   PHYSICAL EXAM:  The patient is currently relatively comfortable.  HEENT exam  is within normal limits.  The chest is clear.  Examination of the back  reveals no CVA or point tenderness.  Cardiovascular exam shows a grade 2/6  systolic murmur best heard in the left sternal border.  It is nonradiating.  The abdomen is benign to palpation.  There is no guarding or rebound and no  masses were appreciated.  A Foley catheter is in place.  On neurologic  testing, cranial nerves, motor, sensory and cerebellar testing is normal.  Examination of the extremities shows no evidence of cyanosis or edema.   IMPRESSION:  1.  Urinary tract infection.   2.  Dehydration.  3.  History of hypertension.  4.  Diabetes.  5.  Chronic back pain.  6.  Status post total knee replacement.  7.  Coronary disease status post stents.  8.  Mitral valve prolapse.  9.  History of cholecystectomy.  10. Allergy to penicillin and sulfa drugs.   We will admit the patient and administer intravenous fluids.  We will follow  her renal function closely.  We will administer IV Cipro for presumed  urinary tract infection.  We will follow her blood sugars on a regular  basis.  Because of the moderate elevation in creatinine, I am going to hold  the ACE inhibitor until we make sure this is stable.  We will give empiric  Lovenox.           ______________________________  Sherin Quarry, MD     SY/MEDQ  D:  08/06/2005  T:  08/06/2005  Job:  [id number]

## 2010-07-10 NOTE — Op Note (Signed)
[facility]  Patient:    Carrie [name], Carrie [name] Visit Number: [id number] MRN: [id number]          Service Type: SUR Location: [id number] Attending Physician:  Twana First Dictated by:   Elana Alm Thurston Hole, M.D. Proc. Date: 09/11/01 Admit Date:  09/11/2001                             Operative Report  PREOPERATIVE DIAGNOSIS:  Right knee degenerative joint disease.  POSTOPERATIVE DIAGNOSIS:  Right knee degenerative joint disease.  PROCEDURE:  Right total knee replacement using [employer] Scorpio total knee system with #9 cemented femoral component, #7 cemented tibial component, with 12 mm polyethylene tibial spacer and 26 mm polyethylene cemented patella.  SURGEON: Elana Alm. Thurston Hole, M.D.  ASSISTANT:  Julien Girt, P.A.  ANESTHESIA:  General.  OPERATIVE TIME:  1 hour 15 minutes.  COMPLICATIONS:  None.  DESCRIPTION OF PROCEDURE:  Ms. Szuch was brought in the operating room on September 11, 2001, placed on the operating room table in supine position.  After an adequate level of general anesthesia was obtained, her right knee was examined under anesthesia.  She had range of motion from -7 to 125 degrees, mild varus deformity, knee stable to ligamentous exam.  She had a Foley catheter placed under sterile conditions and received vancomycin 1 g IV preoperatively for prophylaxis.  Her right leg was then prepped using sterile Duraprep and draped using sterile technique.  The leg was then exsanguinated and a thigh tourniquet elevated to 375 mmHg.  Initially through a 20 cm longitudinal incision based over the patella, initial exposure was made.  The underlying subcutaneous tissues were incised in line with the skin incision. A median arthrotomy was performed revealing an excessive amount of normal- appearing joint fluid.  The articular surfaces were inspected.  She had grade 4 changes medially, grade 3 and 4 changes laterally and in the  patellofemoral joint.  The medial and lateral meniscal remnants were removed as well as the anterior cruciate ligament.  Osteophytes were removed from the femoral condyles and tibial plateau.  An intramedullary drill was then drilled up the femoral canal for placement of the distal femoral cutting jig, which was placed in the appropriate amount of rotation, and the distal 10 mm cut was made.  The distal femur was then sized.  A #9 was found to be the appropriate size, and a #9 cutting jig was placed and then these cuts were made.  After this was done, the proximal tibia was exposed.  Tibial spines were removed with an oscillating saw.  The intramedullary drill drilled down the tibial canal for placement of the proximal tibial cutting jig, which was placed in the appropriate amount of rotation, and an 8 mm proximal tibial cut was made. After this was done the Scorpio PCL cutter was placed back on the distal femur and these cuts were made.  After this was done a #9 femoral trial was placed, a #7 tibial base plate trial was placed, with a 12 mm polyethylene spacer. This gave excellent correction of her varus deformity and full range of motion with full extension with no lift-off on the tray to a range of motion from 0-120 degrees.  The tibial base plate was then marked for rotation and then the keel cut was made in the proximal tibia.  The patella was then sized.  A 26 mm was found to be  the appropriate size and a recessed 10 x 26 mm cut was made and three locking holes were placed.  After this was done it was felt that all the trial components were of excellent size, fit, and stability.  The knee was then jet lavaged and irrigated with 3 L of saline solution.  The proximal tibia was then exposed.  The #7 tibial base plate with cement backing was then hammered into position with an excellent fit with excess cement being removed from around the edges.  The #9 femoral component with cement  backing was hammered into position also with an excellent fit, with excess cement being removed from around the edges.  The 12 mm polyethylene spacer was locked on the tibial base plate.  The knee taken through a range of motion of 0-120 degrees with excellent stability, excellent correction of her varus deformity. A 26 mm polyethylene patella with cement backing was then locked into its recessed hole and held there with a clamp.  After the cement hardened, patellofemoral tracking was evaluated, and this was found to be normal with no need for a lateral release.  At this point, then, it was felt that all the components were of excellent size, fit, and stability.  The knee was further irrigated with antibiotic solution.  The arthrotomy was then closed with #1 Ethibond suture over two medium Hemovac drains.  Subcutaneous tissues closed with 0 and 2-0 Vicryl, skin closed with skin staples.  Sterile dressings were applied.  Hemovac injected with 0.25% Marcaine with epinephrine and clamped. Tourniquet was released.  Femoral nerve block then placed by anesthesia for postoperative pain control.  She was then awakened and taken to the recovery room in stable condition.  Needle and sponge counts correct x2 at the end of the case. Dictated by:   Elana Alm Thurston Hole, M.D. Attending Physician:  Twana First DD:  09/11/01 TD:  09/14/01 Job: [id number]

## 2010-07-10 NOTE — Procedures (Signed)
Carrie [name], STUBBE NO.:  [id number]   MEDICAL RECORD NO.:  [id number]          PATIENT TYPE:  REC   LOCATION:  TPC                          FACILITY:  [facility]   PHYSICIAN:  Erick Colace, M.D.DATE OF BIRTH:  [birth year]   DATE OF PROCEDURE:  11/07/2008  DATE OF DISCHARGE:                               OPERATIVE REPORT   PROCEDURE:  Left L5 dorsal ramus radiofrequency neurotomy, left L4  medial branch radiofrequency neurotomy, left L3 medial branch  radiofrequency neurotomy under fluoroscopic guidance.   INDICATIONS:  Lumbar pain with previous good response.  She has a  history of lumbar spondylosis without myelopathy.  Previous RF March.   Informed consent was obtained after describing risks and benefits of the  procedure with the patient.  These include bleeding, bruising,  infection.  She elects to proceed and has given written consent.  The  patient placed prone on fluoroscopy table.  Betadine prep, sterile  drape, 25-gauge inch and half needle was used to anesthetize the skin  and subcutaneous tissue 1% lidocaine x2 mL at each of 3 sites.  Then, a  20-gauge, 10-cm RF needle with 10-mm curved active tip was inserted  under fluoroscopic guidance first starting at the left S1 SAP sacral  iliac junction, bone contact made, confirmed with lateral imaging.  Sensory stem at 50 Hz followed by motor stem at 2 Hz confirmed proper  needle location followed by injection of 1 mL of solution containing 1  mL of 4 mg/mL dexamethasone 2 mL of 1% MPF lidocaine.  Following  radiofrequency lesioning 70 degrees Celsius for 70 seconds in the left  L5 SAP transverse process junction targeted, bone contact made,  confirmed with lateral imaging.  Sensory stem at 50 Hz followed by motor  stem at 2 Hz confirmed proper needle location followed by 1 injection of  1 mL of dexamethasone lidocaine solution and radiofrequency lesioning 70  degrees Celsius for 70 seconds in the left  L4 SAP transverse process  junction targeted, bone contact made, confirmed with lateral imaging.  Sensory stem at 50 Hz followed by motor stem at 2 Hz confirmed proper  needle location followed by injection of 1 mL of dexamethasone lidocaine  solution and radiofrequency frequency lesioning 70 degrees Celsius for  70 seconds.  The patient tolerated the procedure well.  Pre and post  injection vitals stable.  Post injection instructions given.      Erick Colace, M.D.  Electronically Signed     AEK/MEDQ  D:  11/07/2008 09:40:03  T:  11/07/2008 23:09:13  Job:  [id number]

## 2010-07-10 NOTE — Discharge Summary (Signed)
NAME:  Carrie [name], Carrie [name]                       ACCOUNT NO.:  [id number]   MEDICAL RECORD NO.:  [id number]                   PATIENT TYPE:  IPS   LOCATION:  4147                                 FACILITY:  [facility]   PHYSICIAN:  Charlton Amor, PA               DATE OF BIRTH:  1930-01-22   DATE OF ADMISSION:  09/18/2001  DATE OF DISCHARGE:  09/26/2001                                 DISCHARGE SUMMARY   DISCHARGE DIAGNOSES:  1. Right total knee replacement secondary to osteoarthritis July 21.  2. Anemia.  3. Escherichia coli urinary tract infection, resolved.  4. Episode of supraventricular tachycardia after knee replacement, resolved.  5. Coronary artery disease.  6. Hypertension.  7. Insulin-dependent diabetes mellitus.  8. Hypothyroidism.   HISTORY OF PRESENT ILLNESS:  This is a 77 year old white female admitted  July 21 with end-stage osteoarthritis of the bilateral knees, right greater  than left, and no relief with conservative care.  Underwent a right total  knee replacement July 21 per Dr. Thurston Hole.  Placed on Coumadin for deep vein  thrombosis prophylaxis and weightbearing as tolerated.  Postoperatively with  mild renal insufficiency, creatinine 1.9, improved with intravenous fluids.  Treated with Tequin for E. coli urinary tract infection on July 23.  Cardiology consulted July 23 for episode of SVT.  Received intravenous  Cardizem per Dr. Verdis Prime.  Echocardiogram July 24 with an ejection  fraction of 65%.  No wall abnormalities.  Noted elevated troponin 0.25 felt  to be secondary to prolonged tachycardia.  Cardizem changed to p.o. on July  24.  Intermittent bouts of hypoxia, chest x-ray negative, later resolved.  Minimal assistance for ambulation.  Latest INR 2.6, hemoglobin 8.9.  Admitted for a comprehensive rehab program.   PAST MEDICAL HISTORY:  See discharge diagnoses.   PAST SURGICAL HISTORY:  Cholecystectomy.   ALLERGIES:  1. PENICILLIN.  2. CODEINE.  3.  ALTACE.   PRIMARY CARE PHYSICIAN:  Primary M.D. is Dr. Governor Rooks of [place],  [place].   MEDICATIONS PRIOR TO ADMISSION:  1. Humalog mix 40 units in the a.m. and 50 units in the p.m.  2. Avandia.  3. Norvasc.  4. Tricor.  5. Hyzaar.  6. Synthroid.  7. Aspirin.  8. Fosamax.  9. Kay Ciel.   SOCIAL HISTORY:  Lives with husband in [place], [place].  Independent with a cane prior to admission, living in a one-level home with  two steps to entry.  She plans to stay in [place] for a short time with  her daughter, until mobility improves.  Her husband can also assist.   HOSPITAL COURSE:  The patient did well while on rehabilitation services with  therapies initiated on a b.i.d. basis.  The following issues were followed  during the patient's rehab course.  Pertaining to the patient's right total  knee replacement, site healing nicely.  No signs of infection.  Staples  have  been removed.  She was ambulating extended distances with a walker,  weightbearing as tolerated.  She continued on Coumadin for deep vein  thrombosis prophylaxis.  Latest INR was within normal range.  She would  resume her aspirin therapy after Coumadin is completed.  She will continue  to be followed by [place] per protocol.  Postoperative anemia with latest  hemoglobin September 25, 2001 of 9.6, hematocrit 28.3.  She had an episode of  SVT after her knee replacement.  She was seen in consult by cardiology  services, Dr. Verdis Prime.  She had been placed on Cardizem.  No further  episodes noted.  She had no chest pain or shortness of breath.  Her blood  pressures remained controlled, monitored on Hyzaar.  No signs of fluid  overload noted.  Her blood sugars were 149, 130, and 94.  She continued on  her Humalog mix of 40 units in the a.m. and 54 units in the p.m., as well as  her Avandia.  She had no bowel or bladder disturbances.  She had some mild  nausea, which improved with adjustments in  narcotics.  Overall, for her  function and mobility, she was ambulating household functional distances  with a walker, essentially independent standby assist in all areas of  activities of daily living and dressing, grooming, and homemaking.  Home  health physical and occupational therapy had been arranged, as well as a  nurse for her Coumadin therapy per [place].   LABORATORY DATA:  Latest labs showed a hemoglobin of 9.6, hematocrit 28.3.  Sodium 141, potassium 4.2, BUN 14, creatinine 1.1.  Latest INR of 3.0.   DISCHARGE MEDICATIONS:  1. Coumadin daily.  Dose to be established at time of discharge.  She had     last received 1.5 mg.  2. Potassium chloride 20 mEq daily.  3. Avandia 2 mg daily.  4. Tricor 160 mg daily.  5. Synthroid 100 mcg daily.  6. Insulin Humalog mix 40 units a.m. and 54 in the p.m.  7. Hyzaar 100/25 mg daily.  8. Cardizem CD 240 mg daily.  9. OxyContin as needed for pain.   ACTIVITY:  Weightbearing as tolerated.   DIET:  An 1800-calorie ADA.   WOUND CARE:  Cleanse incision daily with warm soap and water.    FOLLOW UP:  Home health physical and occupational therapy, home health nurse  per [facility] to check Coumadin therapy.  Follow up with Dr. Thurston Hole,  orthopedic services.                                                Charlton Amor, PA    DJA/MEDQ  D:  09/25/2001  T:  09/29/2001  Job:  [id number]   cc:   Elana Alm. Thurston Hole, M.D.   Lesleigh Noe, M.D.

## 2010-07-10 NOTE — Discharge Summary (Signed)
NAME:  Carrie [name], Carrie [name]                       ACCOUNT NO.:  [id number]   MEDICAL RECORD NO.:  [id number]                   PATIENT TYPE:  IPS   LOCATION:  4147                                 FACILITY:  [facility]   PHYSICIAN:  Elana Alm. Thurston Hole, M.D.              DATE OF BIRTH:  1929/05/11   DATE OF ADMISSION:  09/11/2001  DATE OF DISCHARGE:  09/18/2001                                 DISCHARGE SUMMARY   ADMISSION DIAGNOSES:  1. End-stage degenerative joint disease, right knee.  2. Coronary artery disease.  3. Diabetes.  4. Hypertension.  5. Depression.  6. History of phlebitis   DISCHARGE DIAGNOSES:  1. End-stage degenerative joint disease, right knee.  2. Coronary artery disease.  3. Diabetes.  4. Hypertension.  5. Depression.  6. History of phlebitis  7. Hypovolemia.  8. Postoperative blood loss anemia.  9. Urinary tract infection.  10.      Atrial fibrillation para-aortic area.  11.      Atelectasis.  12.      Cardiac dysrhythmia.   HISTORY OF PRESENT ILLNESS:  The patient is a 77 year old female with  history of bilateral end-stage DJD of the right knee.  She has tried anti-  inflammatories, cortisone, and rest,all without success.  At this point in  time, she continues to have pain and discomfort with ambulation and at rest.  She understands the risks,benefits, and possible complications of a right  total knee replacement and is without question.   HOSPITAL COURSE:  One 09/11/2001, the patient underwent a total knee  replacement by Dr. Thurston Hole. She tolerated the procedure well.  She was  admitted postoperatively for DVT prophylaxis, pain control, physical  therapy, and medical management.   On postoperative day #1, the patient was alert and oriented x 3 but was very  groggy.  Hemoglobin was 10.0.  Renal function was decreased with a  creatinine of 1.9.  We increased patient's IV fluids to 75 cc an hour with  normal saline bolus.  PCA was discontinued.  She was  started on [facility] for pain. She was transferred to 5000.  Rehabilitation consult was  ordered who felt she was a candidate for rehabilitation.   On postop day #2, the patient was much better, less groggy, alert and  oriented x 3.  Renal function was improving a creatinine of 1.7.  T-max was  100.2.  Bowels were moving as patient was passing gas. Surgical wound was  well approximated. UA showed a UTI.  She was started on Tequin 400 mg 1 p.o.  q.d. for 7 days.  On the afternoon of postop day #2, the patient dropped her  saturations.  CBGs were 184.  Blood pressure 120/70.  O2 saturation 88% on 2  liters, 93% on 2 liters.   Cardiology was consulted.  EKG showed SVT versus atypical ventricular  tachycardia.  The patient was transferred to telemetry.  Blood  pressure was  140/68, pulse 150.  The patient was given a Diltiazem bolus followed by 10  Diltiazem an hour.   On postop day #3, the patient was CPM 0 to 60 for five hours.  Hemoglobin  9.1.  Cardiac labs were pending.  She is okay to be on the CPM but was not  allowed to mobilize until cardiology cleared her.  The patient had SVT that  reverted back to normal sinus rhythm.  Troponin slightly increased possibly  secondary to her prolonged tachycardia.  A 2-D echocardiogram was ordered.  IV Lasix was ordered, and the patient was placed on p.o. Cardizem.   On postop day #4, the patient was still complaining of shortness of breath  and nausea.  Surgical wound was well approximated.  Her abdomen was rotund  and firm.  She had a significant amount of nausea.  We ordered a flat plate  abdomen to rule out an ileus.  It showed fluid in her colon but no discrete  ileus.  Dr. Evlyn Kanner was consulted for medical management of her ileus.  The  patient was given a laxative of choice.   On postop day #5, the patient had bowel movement, no longer complained of  shortness of breath.  T-max was 99.1.  O2 saturation 97% on 6 liters.  The  patient  had a hypoglycemic event.  Snack was given.  The patient was given  an amp of D50 through her IV.  Sugars improved.   On postop day #6, blood pressure 175/65, O2 saturation 100% on 6 liters. ere  awaiting rehabilitation.   On postop day #7, the patient was doing much better with no complaints.  Surgical wound well approximated, O2 saturation 96% on 4 liters.  Cardiac  arrhythmia has resolved.  Desaturation issues have resolved.  She is stable  froman orthopedic standpoint and stable from a medical standpoint.  Orders  were written to transfer to rehabilitation.   DISPOSITION:  She was transferred to rehabilitation in stable condition with  Percocet for pain, Cardizem p.o. for her heart, Colace 100 mg 1 p.o. q.d.,  Humalog insulin, Hyzaar, Avandia, Synthroid, Pravachol, Norvasc, TriCor, Kay-  Ciel, and calcium.  We will continue to follow her on rehabilitation.  She  is weightbearing as tolerated and on a regular diabetic diet.     Kirstin Shepperson, P.A.                  Robert A. Thurston Hole, M.D.    KS/MEDQ  D:  10/17/2001  T:  10/19/2001  Job:  [id number]

## 2010-07-13 ENCOUNTER — Ambulatory Visit: Payer: Medicare Other | Admitting: Physical Medicine & Rehabilitation

## 2010-07-15 ENCOUNTER — Emergency Department (HOSPITAL_COMMUNITY): Payer: Medicare Other

## 2010-07-15 ENCOUNTER — Inpatient Hospital Stay (HOSPITAL_COMMUNITY)
Admission: EM | Admit: 2010-07-15 | Discharge: 2010-07-19 | DRG: 689 | Disposition: A | Discharge status: Skilled Nursing Facility | Payer: Medicare Other | Attending: Internal Medicine | Admitting: Internal Medicine

## 2010-07-15 DIAGNOSIS — E11319 Type 2 diabetes mellitus with unspecified diabetic retinopathy without macular edema: Secondary | ICD-10-CM | POA: Diagnosis present

## 2010-07-15 DIAGNOSIS — E1139 Type 2 diabetes mellitus with other diabetic ophthalmic complication: Secondary | ICD-10-CM | POA: Diagnosis present

## 2010-07-15 DIAGNOSIS — E876 Hypokalemia: Secondary | ICD-10-CM | POA: Diagnosis not present

## 2010-07-15 DIAGNOSIS — Z794 Long term (current) use of insulin: Secondary | ICD-10-CM

## 2010-07-15 DIAGNOSIS — T40605A Adverse effect of unspecified narcotics, initial encounter: Secondary | ICD-10-CM | POA: Diagnosis present

## 2010-07-15 DIAGNOSIS — N058 Unspecified nephritic syndrome with other morphologic changes: Secondary | ICD-10-CM | POA: Diagnosis present

## 2010-07-15 DIAGNOSIS — E039 Hypothyroidism, unspecified: Secondary | ICD-10-CM | POA: Diagnosis present

## 2010-07-15 DIAGNOSIS — G92 Toxic encephalopathy: Secondary | ICD-10-CM | POA: Diagnosis present

## 2010-07-15 DIAGNOSIS — I129 Hypertensive chronic kidney disease with stage 1 through stage 4 chronic kidney disease, or unspecified chronic kidney disease: Secondary | ICD-10-CM | POA: Diagnosis present

## 2010-07-15 DIAGNOSIS — E785 Hyperlipidemia, unspecified: Secondary | ICD-10-CM | POA: Diagnosis present

## 2010-07-15 DIAGNOSIS — E860 Dehydration: Secondary | ICD-10-CM | POA: Diagnosis present

## 2010-07-15 DIAGNOSIS — N184 Chronic kidney disease, stage 4 (severe): Secondary | ICD-10-CM | POA: Diagnosis present

## 2010-07-15 DIAGNOSIS — M109 Gout, unspecified: Secondary | ICD-10-CM | POA: Diagnosis present

## 2010-07-15 DIAGNOSIS — D649 Anemia, unspecified: Secondary | ICD-10-CM | POA: Diagnosis present

## 2010-07-15 DIAGNOSIS — A498 Other bacterial infections of unspecified site: Secondary | ICD-10-CM | POA: Diagnosis present

## 2010-07-15 DIAGNOSIS — Z7982 Long term (current) use of aspirin: Secondary | ICD-10-CM

## 2010-07-15 DIAGNOSIS — E1129 Type 2 diabetes mellitus with other diabetic kidney complication: Secondary | ICD-10-CM | POA: Diagnosis present

## 2010-07-15 DIAGNOSIS — G929 Unspecified toxic encephalopathy: Secondary | ICD-10-CM | POA: Diagnosis present

## 2010-07-15 DIAGNOSIS — Z79899 Other long term (current) drug therapy: Secondary | ICD-10-CM

## 2010-07-15 DIAGNOSIS — N179 Acute kidney failure, unspecified: Secondary | ICD-10-CM | POA: Diagnosis present

## 2010-07-15 DIAGNOSIS — N390 Urinary tract infection, site not specified: Principal | ICD-10-CM | POA: Diagnosis present

## 2010-07-15 DIAGNOSIS — M810 Age-related osteoporosis without current pathological fracture: Secondary | ICD-10-CM | POA: Diagnosis present

## 2010-07-15 LAB — DIFFERENTIAL
Basophils Absolute: 0.1 10*3/uL (ref 0.0–0.1)
Eosinophils Absolute: 0 10*3/uL (ref 0.0–0.7)
Eosinophils Relative: 0 % (ref 0–5)
Lymphocytes Relative: 9 % — ABNORMAL LOW (ref 12–46)
Lymphs Abs: 0.8 10*3/uL (ref 0.7–4.0)
Neutrophils Relative %: 83 % — ABNORMAL HIGH (ref 43–77)

## 2010-07-15 LAB — POCT I-STAT, CHEM 8
BUN: 49 mg/dL — ABNORMAL HIGH (ref 6–23)
Chloride: 104 mEq/L (ref 96–112)
HCT: 32 % — ABNORMAL LOW (ref 36.0–46.0)
Potassium: 3.6 mEq/L (ref 3.5–5.1)

## 2010-07-15 LAB — URINE MICROSCOPIC-ADD ON

## 2010-07-15 LAB — RAPID URINE DRUG SCREEN, HOSP PERFORMED
Amphetamines: NOT DETECTED
Opiates: NOT DETECTED
Tetrahydrocannabinol: NOT DETECTED

## 2010-07-15 LAB — URINALYSIS, ROUTINE W REFLEX MICROSCOPIC
Glucose, UA: NEGATIVE mg/dL
Ketones, ur: NEGATIVE mg/dL
Nitrite: NEGATIVE
Specific Gravity, Urine: 1.018 (ref 1.005–1.030)
pH: 5 (ref 5.0–8.0)

## 2010-07-15 LAB — GLUCOSE, CAPILLARY: Glucose-Capillary: 152 mg/dL — ABNORMAL HIGH (ref 70–99)

## 2010-07-15 LAB — CBC
MCV: 89.5 fL (ref 78.0–100.0)
Platelets: 290 10*3/uL (ref 150–400)
RBC: 3.42 MIL/uL — ABNORMAL LOW (ref 3.87–5.11)
RDW: 15.4 % (ref 11.5–15.5)
WBC: 9.3 10*3/uL (ref 4.0–10.5)

## 2010-07-15 LAB — HEPATIC FUNCTION PANEL
ALT: 14 U/L (ref 0–35)
AST: 19 U/L (ref 0–37)
Albumin: 2.6 g/dL — ABNORMAL LOW (ref 3.5–5.2)
Bilirubin, Direct: 0.2 mg/dL (ref 0.0–0.3)
Total Protein: 6.5 g/dL (ref 6.0–8.3)

## 2010-07-15 LAB — TRICYCLICS SCREEN, URINE: TCA Scrn: NOT DETECTED

## 2010-07-15 LAB — ACETAMINOPHEN LEVEL: Acetaminophen (Tylenol), Serum: <15 ug/mL (ref 10–30)

## 2010-07-15 LAB — SALICYLATE LEVEL: Salicylate Lvl: <2 mg/dL — ABNORMAL LOW (ref 2.8–20.0)

## 2010-07-16 LAB — CBC
Hemoglobin: 8.9 g/dL — ABNORMAL LOW (ref 12.0–15.0)
MCH: 29.1 pg (ref 26.0–34.0)
MCV: 89.5 fL (ref 78.0–100.0)
RBC: 3.06 MIL/uL — ABNORMAL LOW (ref 3.87–5.11)

## 2010-07-16 LAB — CARDIAC PANEL(CRET KIN+CKTOT+MB+TROPI)
CK, MB: 1.7 ng/mL (ref 0.3–4.0)
Troponin I: <0.3 ng/mL (ref <0.30)

## 2010-07-16 LAB — GLUCOSE, CAPILLARY
Glucose-Capillary: 81 mg/dL (ref 70–99)
Glucose-Capillary: 111 mg/dL — ABNORMAL HIGH (ref 70–99)

## 2010-07-16 LAB — DIFFERENTIAL
Eosinophils Absolute: 0 10*3/uL (ref 0.0–0.7)
Lymphs Abs: 1.4 10*3/uL (ref 0.7–4.0)
Monocytes Absolute: 0.7 10*3/uL (ref 0.1–1.0)
Monocytes Relative: 10 % (ref 3–12)
Neutro Abs: 5 10*3/uL (ref 1.7–7.7)
Neutrophils Relative %: 70 % (ref 43–77)

## 2010-07-16 LAB — MAGNESIUM: Magnesium: 2.2 mg/dL (ref 1.5–2.5)

## 2010-07-16 LAB — BASIC METABOLIC PANEL
Chloride: 104 mEq/L (ref 96–112)
GFR calc Af Amer: 25 mL/min — ABNORMAL LOW (ref >60)
Potassium: 2.5 mEq/L — CL (ref 3.5–5.1)

## 2010-07-16 LAB — FOLATE: Folate: >20 ng/mL

## 2010-07-16 LAB — IRON AND TIBC
TIBC: 180 ug/dL — ABNORMAL LOW (ref 250–470)
UIBC: 169 ug/dL

## 2010-07-16 LAB — FERRITIN: Ferritin: 210 ng/mL (ref 10–291)

## 2010-07-17 LAB — BASIC METABOLIC PANEL
CO2: 30 mEq/L (ref 19–32)
Chloride: 110 mEq/L (ref 96–112)
GFR calc Af Amer: 30 mL/min — ABNORMAL LOW (ref >60)
Glucose, Bld: 66 mg/dL — ABNORMAL LOW (ref 70–99)
Potassium: 4.4 mEq/L (ref 3.5–5.1)
Sodium: 145 mEq/L (ref 135–145)

## 2010-07-17 LAB — GLUCOSE, CAPILLARY
Glucose-Capillary: 114 mg/dL — ABNORMAL HIGH (ref 70–99)
Glucose-Capillary: 86 mg/dL (ref 70–99)
Glucose-Capillary: 96 mg/dL (ref 70–99)

## 2010-07-17 LAB — CBC
HCT: 28.7 % — ABNORMAL LOW (ref 36.0–46.0)
Hemoglobin: 9.4 g/dL — ABNORMAL LOW (ref 12.0–15.0)
MCHC: 32.8 g/dL (ref 30.0–36.0)
RBC: 3.19 MIL/uL — ABNORMAL LOW (ref 3.87–5.11)

## 2010-07-17 NOTE — H&P (Signed)
Carrie [name], Carrie [name] NO.:  [id number]  MEDICAL RECORD NO.:  [id number]           PATIENT TYPE:  E  LOCATION:  [facility]                         FACILITY:  [facility]  PHYSICIAN:  Homero Fellers, MD   DATE OF BIRTH:  Nov 12, 1929  DATE OF ADMISSION:  07/15/2010 DATE OF DISCHARGE:                             HISTORY & PHYSICAL   PRIMARY CARE PHYSICIAN:  Kari Baars, MD.  Please note that the patient was discussed with Dr. Margaretmary Eddy group, but because she went to the nursing home, requested the Hospitalist Service to admit.  CHIEF COMPLAINT:  Altered mental status.  HISTORY OF PRESENT ILLNESS:  This is a 77 year old woman who has been living in the nursing home for the past 1/2 month for rehabilitation after she fractured left wrist.  She was discharged on multiple pain medicines including Dilaudid, fentanyl patch, and Percocet.  She is apparently taking on this medicine at this time even despite several weeks after her fracture.  The patient presented with confusion and altered mental status, and has not been eating well or drinking well for the past few days.  She has chronic back pain as well and only complained when I asked her how was pain from her back on both sides. She denied any pain in the wrist at this time.  Workup in the emergency room was essentially negative except for an obvious urinary tract infection and worsening of her kidney function since discharge.  The patient asked for Foley catheter place and her urine looks like pus. She denied any chest pain, shortness of breath, nausea, vomiting or palpitations.  She is a very poor historian and most of the history was obtained from the charts.  Review of her old records showed that she was admitted here from May 18, 2010 to May 20, 2010 following fracture of left wrist, which was also comminuted and displaced as well as angulated.  She was thought to be a poor surgical candidate and so was managed with  pain medicine and conservative care.  She had fallen about 3 months ago and had sustained a right wrist fracture at that time, which was also managed by a cast by Orthopedic Service.  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  MEDICATIONS:  Also extensive including: 1. Calcitriol. 2. Aspirin. 3. Amlodipine. 4. Allopurinol. 5. Neurontin. 6. MiraLax. 7. Percocet 5/325 one to two tablets q.6 h. 8. Dilaudid 2 mg q.4 h. p.r.n. 9. Glycerin. 10.Colchicine. 11.Lasix. 12.Nitroglycerin. 13.Wellbutrin. 14.Flaxseed. 15.Fish oil. 16.Multivitamin. 17.Levothyroxine 100 mcg daily. 18.Glucosamine. 19.Folic acid. 20.Fentanyl patch 25 mcg every 3 days. 21.Colace. 22.Lantus insulin 28 units nightly. 23.NovoLog insulin 4 units three times a day with meals. 24.Trazodone. 25.Lexapro.  ALLERGIES: 1. PENICILLIN. 2. SULFA. 3. STATINS.  SOCIAL HISTORY:  Notable for alcohol or drug use.  Married.  No history of Alzheimer in this patient.  REVIEW OF SYSTEMS:  Ten-point  review of systems limited because of altered mental status.  PHYSICAL EXAMINATION:  VITAL SIGNS:  Blood pressure of 156/60, pulse 70, respirations 18, temperature is 98.2, O2 sat 100%. GENERAL:  The patient is lying in bed, comfortable, appeared to be in no distress. HEENT:  Mouth is dry. NECK:  Supple.  No JVD, adenopathy, or thyromegaly. LUNGS:  Clear bilaterally to auscultation. HEART:  S1-S2.  No murmurs, rubs, or gallops. ABDOMEN:  Full, soft, nontender.  Bowel sounds present.  No masses. EXTREMITIES:  No edema, clubbing, or cyanosis. NEUROLOGIC:  Cranial nerves II through XII intact.  Speech slow,  but clear.  Sensation normal. SKIN:  No rashes or lesions. PSYCHIATRIC:  Difficult to assess because of altered mental status. LYMPHATICS:  No lymph gland swelling.  Please note that the patient is also drowsy and confused probably related to multiple narcotic medication.  LABORATORY:  Urinalysis showed bacteria many, wbc too numerous to count. CBC; white count normal at 9.3, hemoglobin 10.1, platelet count is 290. Chemistry; sodium is 143, potassium 3.6, BUN 49, creatinine 2.7, which is slightly worse compared to her last BNP, glucose 145.  Liver enzymes grossly normal.  Chest x-ray showed no infiltrates or effusions.  CT of the head showed no acute findings.  EKG; normal sinus rhythm with left anterior fascicular block and LVH.  ASSESSMENT:  A 77 year old woman admitted from nursing home with 1. Altered mental status, etiology unclear, but might be secondary to     underlying infection with possible low-grade sepsis. 2. Urinary tract infection. 3. Acute-on-chronic kidney failure with clinical dehydration. 4. Multiple pain medicines including narcotics, which might actually     be contributing to her altered mental status. 5. Hypothyroidism. 6. Diabetes mellitus with fair control.  PLAN:  Admit to medical floor.  Give IV fluids.  Start Cipro 400 mg IV q.12.  Reduce necrotic dose by cutting down fentanyl dose by half and holding Dilaudid and Percocet.  Her pain level should be reassessed when she is fully awake.  Hopefully, mental status will be improved with reduction of narcotics.  I will also hold Lasix for now while we will rehydrate her.  She can go back to the nursing home when clinically stable.     Homero Fellers, MD     FA/MEDQ  D:  07/15/2010  T:  07/15/2010  Job:  [id number]  Electronically Signed by Homero Fellers  on 07/17/2010 03:31:04 AM

## 2010-07-18 LAB — GLUCOSE, CAPILLARY
Glucose-Capillary: 73 mg/dL (ref 70–99)
Glucose-Capillary: 90 mg/dL (ref 70–99)

## 2010-07-18 LAB — BASIC METABOLIC PANEL
BUN: 28 mg/dL — ABNORMAL HIGH (ref 6–23)
Calcium: 9.6 mg/dL (ref 8.4–10.5)
Chloride: 106 mEq/L (ref 96–112)
Creatinine, Ser: 1.77 mg/dL — ABNORMAL HIGH (ref 0.4–1.2)
GFR calc Af Amer: 34 mL/min — ABNORMAL LOW (ref >60)

## 2010-07-18 LAB — CULTURE, BLOOD (ROUTINE X 2): Culture  Setup Time: 201205240840

## 2010-07-18 LAB — CBC
MCH: 29.4 pg (ref 26.0–34.0)
MCHC: 32.8 g/dL (ref 30.0–36.0)
MCV: 89.5 fL (ref 78.0–100.0)
Platelets: 305 10*3/uL (ref 150–400)
RBC: 2.96 MIL/uL — ABNORMAL LOW (ref 3.87–5.11)

## 2010-07-19 LAB — GLUCOSE, CAPILLARY: Glucose-Capillary: 81 mg/dL (ref 70–99)

## 2010-07-20 LAB — URINE CULTURE

## 2010-07-22 LAB — CULTURE, BLOOD (ROUTINE X 2): Culture  Setup Time: 201205240840

## 2010-07-23 NOTE — Discharge Summary (Signed)
Carrie [name], JAMERSON NO.:  [id number]  MEDICAL RECORD NO.:  [id number]           PATIENT TYPE:  I  LOCATION:  5114                         FACILITY:  [facility]  PHYSICIAN:  Peggye Pitt, M.D. DATE OF BIRTH:  [birth year]  DATE OF ADMISSION:  07/15/2010 DATE OF DISCHARGE:  07/18/2010                        DISCHARGE SUMMARY - REFERRING   PRIMARY CARE PHYSICIAN:  Dr. Buren Kos.  However, because he is currently at a nursing facility, [facility] has been overtaking her medical care.  DISCHARGE DIAGNOSES: 1. Toxic metabolic encephalopathy secondary to urinary tract     infection. 2. E-coli urinary tract infection. 3. Hypokalemia, repleted. 4. Hypothyroidism. 5. Type 2 diabetes mellitus with nephropathy and retinopathy. 6. Hyperlipidemia. 7. Hypertension. 8. Osteoporosis. 9. Chronic kidney disease stage III to IV with a baseline creatinine     between 2.5 and 2.8.  DISCHARGE MEDICATIONS: 1. Cipro 250 mg twice daily for 7 days. 2. Neurontin 100 mg 3 times a day. 3. Trazodone 50 mg to take half a tablet at bedtime. 4. Lexapro 20 mg daily. 5. Wellbutrin XL 300 mg daily. 6. Allopurinol 100 mg daily. 7. Colchicine 0.6 mg every 8 hours as needed for gouty pain. 8. Fish oil 1000 mg twice daily. 9. Colace 100 mg daily. 10.Glycerin suppository as needed daily for constipation. 11.MiraLax 17 g daily as needed for constipation. 12.Amlodipine 5 mg daily. 13.Lantus insulin 20 units injected subcutaneously at bedtime. 14.NovoLog insulin sliding scale. 15.Lasix 20 mg daily. 16.Multivitamin 1 tablet daily. 17.Nitroglycerin 0.4 mg sublingual every 5 minutes x3 as needed for     chest pain. 18.Fentanyl patch 25 mcg transdermally to change over 72 hours. 19.Aspirin 81 mg daily. 20.Synthroid 100 mg daily. 21.Folic acid 1 mg daily. 22.Calcitriol 0.5 mg daily.  DISPOSITION AND FOLLOWUP:  The patient will hopefully be discharged to skilled nursing facility  over the weekend as long as bed is available and patient is stable.  She will need to continue her course of Cipro for the next 7 days.  CONSULTATION THIS HOSPITALIZATION:  None.  IMAGES AND PROCEDURES:  A chest x-ray on May 23 that showed no acute disease, chronic cardiomegaly.  A CT scan of the head on May 23 that showed no acute abnormalities, mild atrophy.  HISTORY AND PHYSICAL:  For complete details, please refer to dictation on May 23 by Dr. Dessie Coma.  But in brief, Carrie [name] is a 77 year old Caucasian lady who was transferred from nursing facility secondary to complaints of altered mentation.  Apparently, Ms. Fluty had been living independently at home until because of weakness and deconditioning.  She has offered 2 wrist fractures within a month's time frame.  After her last admission in March, she was sent to skilled facility for rehabilitation purposes.  And from there was sent to the hospital because of complaints of altered mentation.  HOSPITAL COURSE BY PROBLEM: 1. Toxic metabolic encephalopathy.  This is presumed to be     multifactorial secondary to E-coli urinary tract infection,     medication effect, possibly severe hypokalemia.  This is slowly     improving and is close to returning to her baseline  per family     members that are present at bedside.  We also believe that narcotic     medications may have been contributing to her encephalopathy and     hence we have discontinued all of her narcotics with the exception     of the fentanyl patch as she seems to be doing quite well without     any overt signs of pain. 2. E-coli urinary tract infection.  She has been maintained on IV     Cipro since admission and her mentation is clearing.  I believe     that this was the main cause for her encephalopathy.  At this     point, sensitivities are pending.  However, given the fact that she     has improved, defervesced, and has no longer leukocytosis on the     Cipro, I  believe that this will be an appropriate antibiotic upon     discharge.  There is a slight chance that this may change once     again if sensitivity is back. 3. Rest of her chronic conditions are stable and rest of medications     have not been altered.     Peggye Pitt, M.D.     EH/MEDQ  D:  07/17/2010  T:  07/17/2010  Job:  [id number]  Electronically Signed by Peggye Pitt M.D. on 07/23/2010 07:44:05 AM

## 2010-07-23 NOTE — Discharge Summary (Signed)
  NAMEAILYNE, PAWLEY NO.:  [id number]  MEDICAL RECORD NO.:  [id number]           PATIENT TYPE:  I  LOCATION:  5114                         FACILITY:  [facility]  PHYSICIAN:  Peggye Pitt, M.D. DATE OF BIRTH:  08/10/1929  DATE OF ADMISSION:  07/15/2010 DATE OF DISCHARGE:  07/19/2010                        DISCHARGE SUMMARY - REFERRING   ADDENDUM: Ms. Meinhart was kept on extra 2 days in the hospital waiting for sensitivities from her urine culture.  Finally today after not receiving any reports, I called over the [facility] and they have told me that unfortunately the machine that they used experienced some technical difficulties and discarded the urine culture.  We do know that it is growing greater than 100,000 colonies of E coli.  She has been on Cipro since her admission and has been improving, so I believe that it is prudent to continue Cipro for an extra 7 days orally.  DISCHARGE MEDICATIONS:  The dose of her fentanyl patch was previously dictated as 25 mcg every 72 hours, however, this has been changed to 12.5 mcg over 72 hours.  Rest of discharge medications, diagnoses remain exactly the same.     Peggye Pitt, M.D.     EH/MEDQ  D:  07/19/2010  T:  07/19/2010  Job:  [id number]  Electronically Signed by Peggye Pitt M.D. on 07/23/2010 07:44:28 AM

## 2010-07-28 ENCOUNTER — Emergency Department (HOSPITAL_COMMUNITY): Payer: Medicare Other

## 2010-07-28 ENCOUNTER — Inpatient Hospital Stay (HOSPITAL_COMMUNITY)
Admission: EM | Admit: 2010-07-28 | Discharge: 2010-08-01 | DRG: 689 | Disposition: A | Discharge status: Skilled Nursing Facility | Payer: Medicare Other | Attending: Internal Medicine | Admitting: Internal Medicine

## 2010-07-28 DIAGNOSIS — Z880 Allergy status to penicillin: Secondary | ICD-10-CM

## 2010-07-28 DIAGNOSIS — G92 Toxic encephalopathy: Secondary | ICD-10-CM | POA: Diagnosis present

## 2010-07-28 DIAGNOSIS — M109 Gout, unspecified: Secondary | ICD-10-CM | POA: Diagnosis present

## 2010-07-28 DIAGNOSIS — E876 Hypokalemia: Secondary | ICD-10-CM | POA: Diagnosis not present

## 2010-07-28 DIAGNOSIS — D631 Anemia in chronic kidney disease: Secondary | ICD-10-CM | POA: Diagnosis present

## 2010-07-28 DIAGNOSIS — N390 Urinary tract infection, site not specified: Principal | ICD-10-CM | POA: Diagnosis present

## 2010-07-28 DIAGNOSIS — Z66 Do not resuscitate: Secondary | ICD-10-CM | POA: Diagnosis present

## 2010-07-28 DIAGNOSIS — R5383 Other fatigue: Secondary | ICD-10-CM | POA: Diagnosis present

## 2010-07-28 DIAGNOSIS — F0390 Unspecified dementia without behavioral disturbance: Secondary | ICD-10-CM | POA: Diagnosis present

## 2010-07-28 DIAGNOSIS — E039 Hypothyroidism, unspecified: Secondary | ICD-10-CM | POA: Diagnosis present

## 2010-07-28 DIAGNOSIS — G929 Unspecified toxic encephalopathy: Secondary | ICD-10-CM | POA: Diagnosis present

## 2010-07-28 DIAGNOSIS — N039 Chronic nephritic syndrome with unspecified morphologic changes: Secondary | ICD-10-CM | POA: Diagnosis present

## 2010-07-28 DIAGNOSIS — N183 Chronic kidney disease, stage 3 unspecified: Secondary | ICD-10-CM | POA: Diagnosis present

## 2010-07-28 DIAGNOSIS — N179 Acute kidney failure, unspecified: Secondary | ICD-10-CM | POA: Diagnosis present

## 2010-07-28 DIAGNOSIS — A498 Other bacterial infections of unspecified site: Secondary | ICD-10-CM | POA: Diagnosis present

## 2010-07-28 DIAGNOSIS — I129 Hypertensive chronic kidney disease with stage 1 through stage 4 chronic kidney disease, or unspecified chronic kidney disease: Secondary | ICD-10-CM | POA: Diagnosis present

## 2010-07-28 DIAGNOSIS — E785 Hyperlipidemia, unspecified: Secondary | ICD-10-CM | POA: Diagnosis present

## 2010-07-28 DIAGNOSIS — R5381 Other malaise: Secondary | ICD-10-CM | POA: Diagnosis present

## 2010-07-28 DIAGNOSIS — M810 Age-related osteoporosis without current pathological fracture: Secondary | ICD-10-CM | POA: Diagnosis present

## 2010-07-28 LAB — URINALYSIS, ROUTINE W REFLEX MICROSCOPIC
Bilirubin Urine: NEGATIVE
Nitrite: POSITIVE — AB
Specific Gravity, Urine: 1.015 (ref 1.005–1.030)
Urobilinogen, UA: 0.2 mg/dL (ref 0.0–1.0)
pH: 5.5 (ref 5.0–8.0)

## 2010-07-28 LAB — POCT I-STAT, CHEM 8
BUN: 30 mg/dL — ABNORMAL HIGH (ref 6–23)
Calcium, Ion: 1.25 mmol/L (ref 1.12–1.32)
Chloride: 103 mEq/L (ref 96–112)
HCT: 30 % — ABNORMAL LOW (ref 36.0–46.0)
Potassium: 4 mEq/L (ref 3.5–5.1)

## 2010-07-28 LAB — CBC
HCT: 30.6 % — ABNORMAL LOW (ref 36.0–46.0)
Hemoglobin: 10 g/dL — ABNORMAL LOW (ref 12.0–15.0)
MCH: 29.1 pg (ref 26.0–34.0)
MCV: 89 fL (ref 78.0–100.0)
Platelets: 351 10*3/uL (ref 150–400)
RBC: 3.44 MIL/uL — ABNORMAL LOW (ref 3.87–5.11)
WBC: 5.7 10*3/uL (ref 4.0–10.5)

## 2010-07-28 LAB — DIFFERENTIAL
Eosinophils Absolute: 0.2 10*3/uL (ref 0.0–0.7)
Lymphocytes Relative: 20 % (ref 12–46)
Lymphs Abs: 1.1 10*3/uL (ref 0.7–4.0)
Monocytes Relative: 10 % (ref 3–12)
Neutrophils Relative %: 67 % (ref 43–77)

## 2010-07-28 LAB — CK TOTAL AND CKMB (NOT AT ARMC): CK, MB: 2.2 ng/mL (ref 0.3–4.0)

## 2010-07-28 LAB — TROPONIN I: Troponin I: <0.3 ng/mL (ref <0.30)

## 2010-07-28 LAB — URINE MICROSCOPIC-ADD ON

## 2010-07-29 LAB — COMPREHENSIVE METABOLIC PANEL
ALT: 16 U/L (ref 0–35)
AST: 25 U/L (ref 0–37)
Albumin: 2.4 g/dL — ABNORMAL LOW (ref 3.5–5.2)
Calcium: 9 mg/dL (ref 8.4–10.5)
Chloride: 102 mEq/L (ref 96–112)
Creatinine, Ser: 1.97 mg/dL — ABNORMAL HIGH (ref 0.4–1.2)
GFR calc Af Amer: 30 mL/min — ABNORMAL LOW (ref >60)
Sodium: 136 mEq/L (ref 135–145)

## 2010-07-29 LAB — CBC
HCT: 29.2 % — ABNORMAL LOW (ref 36.0–46.0)
Hemoglobin: 9.6 g/dL — ABNORMAL LOW (ref 12.0–15.0)
MCH: 29.4 pg (ref 26.0–34.0)
MCHC: 32.9 g/dL (ref 30.0–36.0)
RBC: 3.27 MIL/uL — ABNORMAL LOW (ref 3.87–5.11)

## 2010-07-29 LAB — CK TOTAL AND CKMB (NOT AT ARMC)
CK, MB: 2.4 ng/mL (ref 0.3–4.0)
Relative Index: INVALID (ref 0.0–2.5)
Total CK: 36 U/L (ref 7–177)

## 2010-07-29 LAB — GLUCOSE, CAPILLARY
Glucose-Capillary: 133 mg/dL — ABNORMAL HIGH (ref 70–99)
Glucose-Capillary: 121 mg/dL — ABNORMAL HIGH (ref 70–99)

## 2010-07-29 LAB — TROPONIN I
Troponin I: <0.3 ng/mL (ref <0.30)
Troponin I: <0.3 ng/mL (ref <0.30)

## 2010-07-29 LAB — MRSA PCR SCREENING: MRSA by PCR: POSITIVE — AB

## 2010-07-30 LAB — GLUCOSE, CAPILLARY
Glucose-Capillary: 97 mg/dL (ref 70–99)
Glucose-Capillary: 115 mg/dL — ABNORMAL HIGH (ref 70–99)

## 2010-07-30 LAB — BASIC METABOLIC PANEL
BUN: 20 mg/dL (ref 6–23)
CO2: 25 mEq/L (ref 19–32)
Chloride: 102 mEq/L (ref 96–112)
Glucose, Bld: 87 mg/dL (ref 70–99)
Potassium: 3.5 mEq/L (ref 3.5–5.1)

## 2010-07-31 LAB — BASIC METABOLIC PANEL
BUN: 17 mg/dL (ref 6–23)
CO2: 26 mEq/L (ref 19–32)
Calcium: 9.3 mg/dL (ref 8.4–10.5)
Chloride: 107 mEq/L (ref 96–112)
Creatinine, Ser: 1.8 mg/dL — ABNORMAL HIGH (ref 0.4–1.2)

## 2010-07-31 LAB — GLUCOSE, CAPILLARY: Glucose-Capillary: 112 mg/dL — ABNORMAL HIGH (ref 70–99)

## 2010-08-01 LAB — CBC
HCT: 29.4 % — ABNORMAL LOW (ref 36.0–46.0)
Hemoglobin: 9.5 g/dL — ABNORMAL LOW (ref 12.0–15.0)
RBC: 3.31 MIL/uL — ABNORMAL LOW (ref 3.87–5.11)
WBC: 7.1 10*3/uL (ref 4.0–10.5)

## 2010-08-01 LAB — BASIC METABOLIC PANEL
BUN: 17 mg/dL (ref 6–23)
CO2: 24 mEq/L (ref 19–32)
Chloride: 105 mEq/L (ref 96–112)
GFR calc Af Amer: 36 mL/min — ABNORMAL LOW (ref >60)
Glucose, Bld: 92 mg/dL (ref 70–99)
Potassium: 3.1 mEq/L — ABNORMAL LOW (ref 3.5–5.1)

## 2010-08-01 LAB — GLUCOSE, CAPILLARY: Glucose-Capillary: 120 mg/dL — ABNORMAL HIGH (ref 70–99)

## 2010-08-01 LAB — URINE CULTURE: Culture  Setup Time: 201206060208

## 2010-08-04 LAB — CULTURE, BLOOD (ROUTINE X 2)
Culture  Setup Time: 201206060500
Culture: NO GROWTH
Culture: NO GROWTH

## 2010-08-23 NOTE — H&P (Signed)
[name], [name] NO.:  [id number]  MEDICAL RECORD NO.:  [id number]  LOCATION:  1320                         FACILITY:  [facility]  PHYSICIAN:  Lonia Blood, M.D.      DATE OF BIRTH:  [birth year]  DATE OF ADMISSION:  07/28/2010 DATE OF DISCHARGE:                             HISTORY & PHYSICAL   PRIMARY CARE PHYSICIAN:  Lenon Curt. Chilton Si, M.D.  PRESENTING COMPLAINT:  UTI.  HISTORY OF PRESENT ILLNESS:  The patient is a 77 year old female with multiple medical problems, who was admitted from May 23 to Jul 19, 2010, with diagnosis of UTI.  At the time of discharge, the patient was on ciprofloxacin, but the cultures were still pending.  She finished 10 days of ciprofloxacin and apparently has not improved much.  She is back today and a look at the previous culture showed that her renal urinary tract infection then was due to E coli that was resistant to ciprofloxacin and quinolones.  This probably experienced why the patient never got better in the first place.  She is confused which seems to be close to her baseline but no fever or chills.  Mild abdominal pain and weakness.  The patient is a DNR.  The family members are currently with her.  She has continued to have altered mental status.  She is to be independent until 2 months ago when she had a rib fracture and then presented to rehab.  Since then, things have been getting worse, she has been in SNF, even after the last discharge.  Her oral intake has declined in line with her altered mental status.  PAST MEDICAL HISTORY:  Extensive and include: 1. Coronary artery disease. 2. Diabetes. 3. Gout. 4. Hyperlipidemia. 5. Hypertension. 6. Hypothyroidism. 7. Osteoporosis. 8. Chronic renal disease. 9. Recurrent urinary tract infection. 10.History of L4 compression fracture. 11.Her baseline creatinine is somewhere around 2.5. 12.History of diabetic retinopathy. 13.History of gouty arthritis. 14.Recent left wrist  fracture.  ALLERGIES: 1. CODEINE. 2. HYDROCODONE. 3. PENICILLIN. 4. SULFA. 5. STATINS.  CURRENT MEDICATIONS: 1. Colchicine 0.6 mg q.8 hours p.r.n. 2. Lantus 28 units subcutaneously at bedtime. 3. NovoLog insulin sliding scale, 4 units in meals. 4. Nitroglycerine sublingual 0.4 mg q.5 minutes p.r.n. as needed. 5. Florastor 1 capsule daily. 6. Macrodantin 100 mg daily. 7. Lexapro 20 mg daily. 8. Colace 100 mg daily. 9. Allopurinol 100 mg daily. 10.Folic acid 1 mg daily. 11.Levothyroxine 100 mg daily. 12.Aspirin 81 mg daily. 13.Lasix 20 mg daily. 14.Pravastatin 50 mg daily. 15.Neurontin 100 mg daily. 16.Amlodipine 10 mg daily.  SOCIAL HISTORY:  She currently lives in a nursing facility.  No tobacco, alcohol, or IV drug use.  Again, she was able to do all her ADLs and very independent until about 3 months ago.  FAMILY HISTORY:  Noncontributory.  REVIEW OF SYSTEMS:  All systems are reviewed are negative except for HPI.  PHYSICAL EXAMINATION:  VITAL SIGNS:  Temperature 97.6, initial blood pressure of 210/69, currently 175/62, pulse 87, respiratory rate 20, sats 97% on room air. GENERAL:  She is awake, alert, oriented.  Slightly confused but she is in no acute distress. HEENT:  PERLA.  EOMI.  No pallor.  No jaundice.  No rhinorrhea. NECK:  Supple.  No visual JVD.  No lymphadenopathy. RESPIRATORY:  She has good air entry bilateral.  No wheezing.  No rales. No crackles. CARDIOVASCULAR:  She has S1 and S2.  No murmur. ABDOMEN:  Soft, full, nontender.  Positive bowel sounds. EXTREMITIES:  No edema, cyanosis, or clubbing. SKIN:  No rashes.  No ulcers.  LABORATORY DATA:  Urinalysis showed turbid urine with some moderate hemoglobin, some proteinuria, nitrite positive, large leukocyte esterase, rare epithelial cells, but wbc's too numerous to count, rbc's nil, with many bacteria.  Her white count is 5.7, hemoglobin 10.0, platelet count of 351,000.  Her MCV is 89.  Sodium 140,  potassium 4.0, chloride 103, BUN 30, creatinine 2.5, glucose 100, ionized calcium 1.25. Chest x-ray showed cardiomegaly without acute disease.  Head CT without contrast showed no acute intracranial abnormality.  There are progressive white matter changes since 2007, probably secondary to small vessel ischemia.  ASSESSMENT:  This is a 77 year old female with known history of urinary tract infection, who was treated about 3 weeks ago, unfortunately wit Cipro.  Urine cultures showed that the Escherichia coli is resistant to Cipro.  Hence, she has probably not recovered from that. She also has some baseline anemia of chronic disease, diabetes, hypertension, among other things.  There is also coronary artery disease, hypothyroidism.  PLAN: 1. Persistent UTI.  Neither we the patient's organism and sensitivity,     will admit her and start her on IV Rocephin.  She is penicillin     allergic, it seems like it is not anaphylactic reaction.  So, she     should be able to tolerate Rocephin.  Will continue the Macrodantin     which is the other antibiotic that she seemed to have respond to,     but it does not seem to be as effective since she has been on that     as an outpatient.  Will get another urine culture and blood culture     and follow closely. 2. Diabetes.  I will continue her home medications without any change. 3. Hypertension.  Again, will continue with home medications. 4. Hypothyroidism.  I will check TSH and continue with her     levothyroxine. 5. Coronary artery disease, seems stable.  No chest pain.  Will     follow her cardiac enzymes empirically to be sure. 6. Chronic kidney disease, 2.  Her BUN and creatinine are at baseline. 7. Gout.  Will continue her on home medications.  She seems to be okay     at this point.  There looks no evidence of any gouty attack. 8. Hyperlipidemia.  The patient is allergic to statins apparently.     Further treatment depends on urine culture  and will get the     patient's response to antibiotics.     Lonia Blood, M.D.     [name]  D:  07/29/2010  T:  07/29/2010  Job:  [id number]  Electronically Signed by Lonia Blood M.D. on 08/23/2010 12:30:13 AM

## 2010-08-27 ENCOUNTER — Emergency Department (HOSPITAL_COMMUNITY)
Admission: EM | Admit: 2010-08-27 | Discharge: 2010-08-27 | Disposition: A | Discharge status: Home / Self Care | Payer: Medicare Other | Attending: Emergency Medicine | Admitting: Emergency Medicine

## 2010-08-27 ENCOUNTER — Emergency Department (HOSPITAL_COMMUNITY): Payer: Medicare Other

## 2010-08-27 DIAGNOSIS — R51 Headache: Secondary | ICD-10-CM | POA: Insufficient documentation

## 2010-08-27 DIAGNOSIS — Y92009 Unspecified place in unspecified non-institutional (private) residence as the place of occurrence of the external cause: Secondary | ICD-10-CM | POA: Insufficient documentation

## 2010-08-27 DIAGNOSIS — W19XXXA Unspecified fall, initial encounter: Secondary | ICD-10-CM | POA: Insufficient documentation

## 2010-08-27 DIAGNOSIS — E119 Type 2 diabetes mellitus without complications: Secondary | ICD-10-CM | POA: Insufficient documentation

## 2010-08-27 DIAGNOSIS — I10 Essential (primary) hypertension: Secondary | ICD-10-CM | POA: Insufficient documentation

## 2010-08-27 LAB — BASIC METABOLIC PANEL
BUN: 27 mg/dL — ABNORMAL HIGH (ref 6–23)
Calcium: 9.2 mg/dL (ref 8.4–10.5)
Creatinine, Ser: 1.7 mg/dL — ABNORMAL HIGH (ref 0.50–1.10)
GFR calc Af Amer: 35 mL/min — ABNORMAL LOW (ref >60)
GFR calc non Af Amer: 29 mL/min — ABNORMAL LOW (ref >60)
Potassium: 4 mEq/L (ref 3.5–5.1)

## 2010-08-27 LAB — URINALYSIS, ROUTINE W REFLEX MICROSCOPIC
Bilirubin Urine: NEGATIVE
Leukocytes, UA: NEGATIVE
Nitrite: NEGATIVE
Specific Gravity, Urine: 1.015 (ref 1.005–1.030)
pH: 6.5 (ref 5.0–8.0)

## 2010-08-27 LAB — DIFFERENTIAL
Eosinophils Absolute: 0.1 10*3/uL (ref 0.0–0.7)
Lymphocytes Relative: 8 % — ABNORMAL LOW (ref 12–46)
Lymphs Abs: 0.8 10*3/uL (ref 0.7–4.0)
Neutro Abs: 8.8 10*3/uL — ABNORMAL HIGH (ref 1.7–7.7)
Neutrophils Relative %: 86 % — ABNORMAL HIGH (ref 43–77)

## 2010-08-27 LAB — CBC
MCV: 91 fL (ref 78.0–100.0)
Platelets: 290 10*3/uL (ref 150–400)
RBC: 3.23 MIL/uL — ABNORMAL LOW (ref 3.87–5.11)
WBC: 10.2 10*3/uL (ref 4.0–10.5)

## 2010-08-27 LAB — URINE MICROSCOPIC-ADD ON

## 2010-08-30 LAB — URINE CULTURE

## 2010-09-08 ENCOUNTER — Emergency Department (HOSPITAL_COMMUNITY): Payer: Medicare Other

## 2010-09-08 ENCOUNTER — Inpatient Hospital Stay (HOSPITAL_COMMUNITY)
Admission: EM | Admit: 2010-09-08 | Discharge: 2010-09-11 | DRG: 534 | Disposition: A | Discharge status: Skilled Nursing Facility | Payer: Medicare Other | Attending: Internal Medicine | Admitting: Internal Medicine

## 2010-09-08 DIAGNOSIS — R627 Adult failure to thrive: Secondary | ICD-10-CM | POA: Diagnosis present

## 2010-09-08 DIAGNOSIS — Y92009 Unspecified place in unspecified non-institutional (private) residence as the place of occurrence of the external cause: Secondary | ICD-10-CM

## 2010-09-08 DIAGNOSIS — E039 Hypothyroidism, unspecified: Secondary | ICD-10-CM | POA: Diagnosis present

## 2010-09-08 DIAGNOSIS — E11319 Type 2 diabetes mellitus with unspecified diabetic retinopathy without macular edema: Secondary | ICD-10-CM | POA: Diagnosis present

## 2010-09-08 DIAGNOSIS — N184 Chronic kidney disease, stage 4 (severe): Secondary | ICD-10-CM | POA: Diagnosis present

## 2010-09-08 DIAGNOSIS — R269 Unspecified abnormalities of gait and mobility: Secondary | ICD-10-CM | POA: Diagnosis present

## 2010-09-08 DIAGNOSIS — I129 Hypertensive chronic kidney disease with stage 1 through stage 4 chronic kidney disease, or unspecified chronic kidney disease: Secondary | ICD-10-CM | POA: Diagnosis present

## 2010-09-08 DIAGNOSIS — W07XXXA Fall from chair, initial encounter: Secondary | ICD-10-CM | POA: Diagnosis present

## 2010-09-08 DIAGNOSIS — E785 Hyperlipidemia, unspecified: Secondary | ICD-10-CM | POA: Diagnosis present

## 2010-09-08 DIAGNOSIS — M810 Age-related osteoporosis without current pathological fracture: Secondary | ICD-10-CM | POA: Diagnosis present

## 2010-09-08 DIAGNOSIS — S72443A Displaced fracture of lower epiphysis (separation) of unspecified femur, initial encounter for closed fracture: Principal | ICD-10-CM | POA: Diagnosis present

## 2010-09-08 DIAGNOSIS — F329 Major depressive disorder, single episode, unspecified: Secondary | ICD-10-CM | POA: Diagnosis present

## 2010-09-08 DIAGNOSIS — F3289 Other specified depressive episodes: Secondary | ICD-10-CM | POA: Diagnosis present

## 2010-09-08 DIAGNOSIS — N058 Unspecified nephritic syndrome with other morphologic changes: Secondary | ICD-10-CM | POA: Diagnosis present

## 2010-09-08 DIAGNOSIS — E1129 Type 2 diabetes mellitus with other diabetic kidney complication: Secondary | ICD-10-CM | POA: Diagnosis present

## 2010-09-08 DIAGNOSIS — E1139 Type 2 diabetes mellitus with other diabetic ophthalmic complication: Secondary | ICD-10-CM | POA: Diagnosis present

## 2010-09-08 DIAGNOSIS — N179 Acute kidney failure, unspecified: Secondary | ICD-10-CM | POA: Diagnosis present

## 2010-09-08 DIAGNOSIS — Z96659 Presence of unspecified artificial knee joint: Secondary | ICD-10-CM

## 2010-09-08 DIAGNOSIS — D631 Anemia in chronic kidney disease: Secondary | ICD-10-CM | POA: Diagnosis present

## 2010-09-08 NOTE — Discharge Summary (Signed)
NAMEKAYANA, THOEN NO.:  [id number]  MEDICAL RECORD NO.:  [id number]  LOCATION:  1320                         FACILITY:  [facility]  PHYSICIAN:  Zannie Cove, MD     DATE OF BIRTH:  [birth year]  DATE OF ADMISSION:  07/28/2010 DATE OF DISCHARGE:                         DISCHARGE SUMMARY-REFERRING   PRIMARY CARE PHYSICIAN:  Lenon Curt. Chilton Si, M.D.  DISCHARGE DIAGNOSES: 1. Persistent E coli urinary tract infection. 2. Dementia. 3. Toxic metabolic encephalopathy due to urinary tract infection and     dementia. 4. Hypertension. 5. Hypothyroidism. 6. History of coronary artery disease. 7. Chronic kidney disease, stage III. 8. History of osteoporosis. 9. Dyslipidemia. 10.History of gouty arthritis.  DISCHARGE MEDICATIONS: 1. Tylenol 650 mg every 4 hours p.r.n. 2. Cefpodoxime 100 mg p.o. b.i.d. for three more days. 3. Fentanyl patch 12.5 mcg per hour transdermal every 3 days. 4. Hydralazine 50 mg p.o. three times a day. 5. Lantus 20 units subcu at bedtime. 6. Allopurinol 100 mg daily. 7. Amlodipine 10 mg daily. 8. Aspirin 81 mg daily. 9. Colace 100 mg daily. 10.Colchicine 0.6 mg p.o. every 6 hours p.r.n. 11.Florastor one capsule daily for 10 more days. 12.Folic acid 1 mg daily. 13.Lasix 20 mg daily. 14.Levothyroxine 100 mcg daily. 15.Lexapro 20 mg daily. 16.Neurontin 100 mg capsule t.i.d. 17.Sublingual nitroglycerin 0.4 mg every 5 minutes up to three doses     as needed. 18.NovoLog 4 units subcu t.i.d. with meals. 19.NovoLog sliding scale three times a day with meals. 20.Trazodone 50 mg p.o. at bedtime.  DIAGNOSTICS INVESTIGATION:  CT of the head, 6/5, shows no acute intracranial abnormality, progression of white matter changes, fever, small vessel ischemia.  Chest X-Ray:  Cardiomegaly without acute disease.  HISTORY OF PRESENT ILLNESS:  The patient is a very pleasant 77 year old Caucasian female who was recently discharged for the hospital  after treatment for E coli UTI, presented to the hospital with altered mental status and weakness.  On further evaluation, she was found to be discharged on ciprofloxacin.  Since last time she was discharged, her cultures were pending.  However, the E coli that grew from her urine cultures was not susceptible to quinolones. 1. For E coli urinary tract infection, she was treated with IV     ceftriaxone for last 4 days.  She clinically had significant     response in terms of mentation, white count, as well as fevers.  At     this point, her urine cultures are still pending, but based on     prior cultures from last admission, I am going to change her to     cefpodoxime 100 mg b.i.d. for three more days to complete a 7-day     course.  If her urine culture report has come back and she is     resistant to this, I will call and change the antibiotics     accordingly. 2. Chronic kidney disease, stage III.  Her creatinine improved     throughout her hospital stay with hydration to 1.5 and 0.6 range. 3. Dementia.  I suspect she has underlying mild dementia.  She has     atrophy and small vessel disease on her CT  of her brain and has had     a progressive functional decline.  This has been multiple prior     hospital stays per previous discharge summaries as well.  Recommend     followup with her primary physician to determine need for therapy,     etc.  Discharge condition is stable.  PHYSICAL EXAMINATION:  VITAL SIGNS: Temperature is 98.3, pulse 74, blood pressure 170/65, respirations 19, satting 96% on room air.  DISCHARGE LABORATORY:  White count 7.1, hemoglobin 9.5, platelets 326. Chemistry, sodium 138, potassium 3.1, chloride 105, bicarb 24, BUN 17, creatinine 1.6, glucose 92.  Discharge follow-up with primary physician, Dr. Murray Hodgkins in 1 week.     Zannie Cove, MD     PJ/MEDQ  D:  08/01/2010  T:  08/01/2010  Job:  [id number]  cc:   Lenon Curt Chilton Si, M.D. Fax:  [phone]  Electronically Signed by Zannie Cove  on 09/08/2010 07:17:56 PM

## 2010-09-09 ENCOUNTER — Inpatient Hospital Stay (HOSPITAL_COMMUNITY): Payer: Medicare Other

## 2010-09-09 LAB — COMPREHENSIVE METABOLIC PANEL
ALT: 22 U/L (ref 0–35)
AST: 30 U/L (ref 0–37)
Albumin: 2.3 g/dL — ABNORMAL LOW (ref 3.5–5.2)
CO2: 31 mEq/L (ref 19–32)
Chloride: 95 mEq/L — ABNORMAL LOW (ref 96–112)
GFR calc non Af Amer: 22 mL/min — ABNORMAL LOW (ref >60)
Sodium: 135 mEq/L (ref 135–145)
Total Bilirubin: 0.5 mg/dL (ref 0.3–1.2)

## 2010-09-09 LAB — BASIC METABOLIC PANEL
CO2: 35 mEq/L — ABNORMAL HIGH (ref 19–32)
Calcium: 9.4 mg/dL (ref 8.4–10.5)
Chloride: 98 mEq/L (ref 96–112)
Glucose, Bld: 154 mg/dL — ABNORMAL HIGH (ref 70–99)
Potassium: 4.1 mEq/L (ref 3.5–5.1)
Sodium: 137 mEq/L (ref 135–145)

## 2010-09-09 LAB — CBC
HCT: 30.2 % — ABNORMAL LOW (ref 36.0–46.0)
Hemoglobin: 10.2 g/dL — ABNORMAL LOW (ref 12.0–15.0)
Hemoglobin: 9.8 g/dL — ABNORMAL LOW (ref 12.0–15.0)
MCH: 29.7 pg (ref 26.0–34.0)
MCH: 30.3 pg (ref 26.0–34.0)
MCV: 91.5 fL (ref 78.0–100.0)
RBC: 3.3 MIL/uL — ABNORMAL LOW (ref 3.87–5.11)
RBC: 3.37 MIL/uL — ABNORMAL LOW (ref 3.87–5.11)

## 2010-09-09 LAB — URINALYSIS, ROUTINE W REFLEX MICROSCOPIC
Bilirubin Urine: NEGATIVE
Glucose, UA: 100 mg/dL — AB
Hgb urine dipstick: NEGATIVE
Ketones, ur: NEGATIVE mg/dL
pH: 6.5 (ref 5.0–8.0)

## 2010-09-09 LAB — DIFFERENTIAL
Basophils Relative: 1 % (ref 0–1)
Monocytes Relative: 6 % (ref 3–12)
Neutro Abs: 6.2 10*3/uL (ref 1.7–7.7)
Neutrophils Relative %: 81 % — ABNORMAL HIGH (ref 43–77)

## 2010-09-09 LAB — GLUCOSE, CAPILLARY

## 2010-09-09 LAB — URINE MICROSCOPIC-ADD ON

## 2010-09-09 LAB — MRSA PCR SCREENING: MRSA by PCR: NEGATIVE

## 2010-09-09 LAB — TSH: TSH: 0.54 u[IU]/mL (ref 0.350–4.500)

## 2010-09-10 LAB — CBC
HCT: 27.6 % — ABNORMAL LOW (ref 36.0–46.0)
MCH: 30.3 pg (ref 26.0–34.0)
MCV: 92 fL (ref 78.0–100.0)
Platelets: 228 10*3/uL (ref 150–400)
RBC: 3 MIL/uL — ABNORMAL LOW (ref 3.87–5.11)
WBC: 6.1 10*3/uL (ref 4.0–10.5)

## 2010-09-10 LAB — BASIC METABOLIC PANEL
BUN: 44 mg/dL — ABNORMAL HIGH (ref 6–23)
CO2: 31 mEq/L (ref 19–32)
Calcium: 8.7 mg/dL (ref 8.4–10.5)
Chloride: 101 mEq/L (ref 96–112)
Creatinine, Ser: 2.13 mg/dL — ABNORMAL HIGH (ref 0.50–1.10)
Glucose, Bld: 154 mg/dL — ABNORMAL HIGH (ref 70–99)

## 2010-09-10 LAB — GLUCOSE, CAPILLARY
Glucose-Capillary: 113 mg/dL — ABNORMAL HIGH (ref 70–99)
Glucose-Capillary: 159 mg/dL — ABNORMAL HIGH (ref 70–99)
Glucose-Capillary: 162 mg/dL — ABNORMAL HIGH (ref 70–99)

## 2010-09-11 LAB — BASIC METABOLIC PANEL
CO2: 31 mEq/L (ref 19–32)
Chloride: 100 mEq/L (ref 96–112)
Creatinine, Ser: 1.8 mg/dL — ABNORMAL HIGH (ref 0.50–1.10)
GFR calc Af Amer: 33 mL/min — ABNORMAL LOW (ref >60)
Potassium: 3.9 mEq/L (ref 3.5–5.1)
Sodium: 135 mEq/L (ref 135–145)

## 2010-09-11 LAB — CBC
HCT: 27.2 % — ABNORMAL LOW (ref 36.0–46.0)
Hemoglobin: 8.9 g/dL — ABNORMAL LOW (ref 12.0–15.0)
MCV: 91.3 fL (ref 78.0–100.0)
RBC: 2.98 MIL/uL — ABNORMAL LOW (ref 3.87–5.11)
RDW: 16.8 % — ABNORMAL HIGH (ref 11.5–15.5)
WBC: 5.6 10*3/uL (ref 4.0–10.5)

## 2010-09-11 LAB — GLUCOSE, CAPILLARY: Glucose-Capillary: 123 mg/dL — ABNORMAL HIGH (ref 70–99)

## 2010-09-14 NOTE — Discharge Summary (Signed)
[name], [name] NO.:  [id number]  MEDICAL RECORD NO.:  [id number]  LOCATION:  1337                         FACILITY:  [facility]  PHYSICIAN:  Kari Baars, M.D.  DATE OF BIRTH:  [birth year]  DATE OF ADMISSION:  09/08/2010 DATE OF DISCHARGE:  09/11/2010                              DISCHARGE SUMMARY   DISCHARGE DIAGNOSES: 1. Right femur fracture. 2. Gait instability with recurrent falls. 3. Osteoporosis with left wrist fracture (May 17, 2010), right wrist     fracture (February 2012 ), L4 compression fracture, and now right     femur fracture. 4. Failure to thrive. 5. Diabetes mellitus type 2 with renal, neurologic, and ophthalmologic     complications. 6. Hypertension. 7. Acute on chronic renal failure, stage 4. 8. Hyperlipidemia. 9. Hypothyroidism. 10.Anemia of chronic disease. 11.Gout. 12.Status post right shoulder surgery (2006). 13.Status post left shoulder surgery (1997). 14.Status post left cataract repair (2007). 15.Status post right total knee replacement. 16.Status post cholecystectomy. 17.Depression.  DISCHARGE MEDICATIONS: 1. Clonidine 0.1 mg p.o. daily. 2. Colace 100 mg daily. 3. Lovenox 30 mg subcu daily until she is ambulatory for DVT     prophylaxis. 4. NovoLog sliding scale insulin - moderate intensity. 5. Oxycodone 5 mg every 6 hours p.r.n. moderate to severe pain. 6. MiraLax 17 g daily. 7. Tramadol 50 mg q.8 h p.r.n. mild to moderate pain. 8. Furosemide 40 mg daily (she was previously on twice a day and this     should be adjusted as her volume status warrants). 9. Allopurinol 100 mg daily. 10.Alprazolam 0.5 mg q.8 h p.r.n. anxiety. 11.Amlodipine 2.5 mg daily. 12.Aspirin 81 mg daily. 13.Calcitriol 0.25 mg daily. 14.Colchicine 0.6 mg q.8 h p.r.n. gout flare. 15.Estrace vaginal cream one application every Monday, Wednesday and     Friday. 16.Folgard 2.2 mg/25/1 mg daily. 17.Hydralazine 25 mg q.8 h 18.Lantus 30 units  subcu q.h.s. 19.Levothyroxine 100 mcg daily. 20.Lexapro 20 mg daily. 21.Neurontin 100 mg t.i.d. 22.Nitroglycerin 0.4 mg p.r.n. chest pain. 23.Tylenol 650 mg p.r.n. mild pain. 24.Artificial tears as needed.  HOSPITAL PROCEDURES: 1. Right knee x-ray (September 08, 2010), mildly impacted fracture of the     right lateral femoral metadiaphysis with joint effusion.  Status     post right total knee arthroplasty with no adverse hardware     features. 2. Right hip x-ray (September 08, 2010), no acute fracture or dislocation.  HOSPITAL CONSULTS: 1. Orthopedics - Dr. Dion Saucier 2. Physical Therapy/Occupational Therapy.  HISTORY OF PRESENT ILLNESS:  For full details, please see dictated history and physical by Dr. Timothy Lasso.  Briefly, Carrie [name] is a 77 year old white female with a history of diabetes mellitus type 2 with low renal, neurologic, and ophthalmologic complications, osteoporosis with multiple prior fractures, and prior right total knee replacement who presented to the emergency department with a complaint of right leg pain following a fall.  The patient has recently been a resident at [facility] following bilateral wrist fractures in March.  She remained there for several months before returning home about 3 weeks ago.  Shortly after returning home, she did have a fall and presented to the emergency department.  There were no injuries at that  time and she was discharged home.  Unfortunately, she has not followed up with me since discharge and represented to the emergency department on September 09, 2010, after she slipped out of her lift chair falling to the ground.  She developed right leg pain that worsened over night prompted her to come to the emergency department the following day after a fall where she was found to have an impacted femur fracture.  Orthopedics was called and recommended medical admission as this would be a nonoperative management.  The patient was admitted for further  management.  HOSPITAL COURSE:  The patient was admitted to a medical bed.  She was placed in a right leg immobilizer.  She was evaluated by Physical Therapy, Occupational Therapy and Orthopedics.  Orthopedics recommended toe-touch weightbearing status and continued knee immobilizer with pain control.  Her pain has been controlled with oxycodone.  She is requiring maximal assistance for transfers and ambulation and will require skilled nursing facility placement for rehabilitation prior to returning home. She is not capable of going home at this point given her limitations.  The patient was also found to have slight increase in her creatinine on admission.  Her Lasix was held and she was gently hydrated with improvement in her creatinine from 2.2 to 1.8.  With holding her Lasix, her blood pressure has trended up and so her Lasix will be restarted at daily dosing rather than b.i.d. dosing.  Close attention should be given to her fluid status and this should be adjusted as needed.  She does have chronic renal insufficiency, stage 3 to 4, and has been followed by Nephrology in the past.  Her sugars remained stable and the patient otherwise had no complications during this hospitalization.  She has been on Lovenox for DVT prophylaxis which will be continued until she is more ambulatory.  At this point, the patient is stable from a medical standpoint for discharge to skilled nursing facility for rehabilitation prior to return home.  DISCHARGE DIET:  Cardiac prudent, carb modified medium  DISCHARGE INSTRUCTIONS:  She was instructed to remain in the leg immobilizer at all times when ambulating.  She is toe-touch weightbearing as tolerated.  She should continue physical therapy and occupational therapy.  She is to check her sugars q.a.c. and q.h.s. and cover with sliding scale insulin, in addition to her schedule Lantus.  HOSPITAL FOLLOWUP:  She should follow up with Dr. Dion Saucier at [facility] in 2 weeks.  She should follow up with Dr. Clelia Croft in 1 week after discharge from the skilled nursing facility.  Her primary medical care will be provided by the skilled nursing facility physician.  CONDITION ON DISCHARGE:  Improved.  DISCHARGE LABS:  CBC shows a white count of 5.6, hemoglobin 8.9, platelets 253.  BMET shows sodium 135, potassium 3.9, chloride 100, bicarb 31, BUN 38, creatinine 1.8, glucose 83.  TSH was 0.54. Urinalysis was negative with greater than 300 protein during this hospitalization.  DISPOSITION:  To skilled nursing facility acute rehab pending bed availability.  Time of discharge activity, 25 minutes.     Kari Baars, M.D.     WS/MEDQ  D:  09/11/2010  T:  09/11/2010  Job:  [id number]  cc:   Eulas Post, MD Fax: [phone]  Kari Baars, M.D. Fax: [phone]  Electronically Signed by Lacretia Nicks. Buren Kos M.D. on 09/14/2010 10:05:43 PM

## 2010-09-19 NOTE — H&P (Signed)
NAMEMarland Kitchen  [name], [name] NO.:  [id number]  MEDICAL RECORD NO.:  [id number]  LOCATION:  1337                         FACILITY:  [facility]  PHYSICIAN:  Carrie Pounds, MD       DATE OF BIRTH:  06/14/1929  DATE OF ADMISSION:  09/08/2010 DATE OF DISCHARGE:                             HISTORY & PHYSICAL   PRIMARY CARE PROVIDER:  Kari Baars, MD  ORTHOPEDIC SURGEON:  Carrie Post, MD  CHIEF COMPLAINT:  Fall and impacted femur fracture.  HISTORY OF PRESENT ILLNESS:  A 77 year old female with multiple problems status [name] admission late March with wrist fracture and fall, lengthy rehab at [facility] afterwards, had a UTI with encephalopathy, had seen Uro.  She was discharged back home recently and she has been doing well.  Her husband has dementia.  She was in her lift chair.  Her gown was slit, she slid out and fell onto the ground, was in pain, eventually proceeded to the emergency department.  Here in the emergency department, she was diagnosed with an impacted femur fracture of the right lateral femoral metaphysis.  They called ortho and said she can return to nursing home later and they said that she can be touchdown weightbearing,  put the knee in a knee immobilizer and follow up as an outpatient.  Unfortunately, because of pain and failure to thrive, she is unable to go home and needs admission.  She was treated in the emergency department with ibuprofen and Vicodin, and I was called for inpatient admission.  PAST MEDICAL HISTORY:  Includes: 1. Recent admissions for persistent E-coli urinary tract infections     with toxic metabolic encephalopathy. 2. There is a diagnosis of dementia, but unsure how accurate that is. 3. Hypertension. 4. Hypothyroidism. 5. History of coronary artery disease, status [name] PCI in 1999. 6. Celiac sprue. 7. 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 8. Gout. 9.  Hyperlipidemia. 10.Osteoporosis with L4 compression fracture, left wrist fracture     May 17, 2010; right wrist fracture in February 2012. 11.Chronic failure-to-thrive due to multiple recent fractures, status     [name] recent admissions to the hospital for urinary tract infections     and toxic metabolic encephalopathy and chronic back pain and     multiple medical comorbidities.  She is status [name] skilled nursing     facility rehab stays at 2 different skilled nursing facilities. 12.History of pneumonia. 13.Chronic kidney disease, stage 4. 14.History of gout. 15.Status [name] right shoulder surgery in 2006. 16.Status [name] left shoulder surgery in 1997. 17.Status [name] left cataract repair in 2007. 18.Status [name] right total knee replacement. 19.Status [name] cholecystectomy. 20.Depression. 21.History of chronic lower extremity edema. 22.History of restless legs syndrome. 23.Obstructive sleep apnea. 24.History of insomnia. 25.Chronic fatigue.  ALLERGIES:  Include: 1. PENICILLIN. 2. SULFA. 3. STATINS.  MEDICATIONS: 1. Tylenol 650 mg every 4 hours p.r.n. pain. 2. Fentanyl patch 12.5 mcg per hour, change every 3 days.  She is     currently not wearing this and is unsure if she has been using this     lately. 3. Hydralazine 50 mg p.o. t.i.d. 4. Lantus 20 units  subcu at bedtime. 5. Allopurinol 100 daily. 6. Amlodipine 10 mg daily. 7. Aspirin 81 daily. 8. Colace 100 daily 9. Colchicine 0.6 mg p.o. q.6 h. p.r.n. gout. 10.Folic acid 1 mg daily. 11.Lasix 20 mg daily. 12.Levothyroxine 100 mcg p.o. daily. 13.Lexapro 20 mg p.o. daily. 14.Neurontin 100 mg p.o. t.i.d. 15.Nitroglycerin sublingual 0.4 mg p.r.n. chest pain. 16.NovoLog 4 units subcu t.i.d. with meals along with sliding scale. 17.Trazodone 50 mg p.o. at bedtime.  SOCIAL HISTORY:  She resides at home with her disabled husband with Alzheimer's.  She has 3 children and 2 grandchildren.  She is retired. No alcohol or  drugs.  FAMILY HISTORY:  Heart disease, diabetes, breast cancer.  REVIEW OF SYSTEMS:  She denies any headaches or sinus issues.  She said she has been doing well [name] discharge from skilled nursing facility. She denies any chest pain, shortness of breath, or cardiopulmonary type issue.  She denies any abdominal issues.  Denies any nausea, vomiting, or diarrhea.  She is having some incontinence.  Besides the fall tonight, she has not any other falls recently.  She has not gone back to see Carrie [name] yet.  Full review of systems was obtained and otherwise negative.  PHYSICAL EXAMINATION:  VITAL SIGNS:  Temperature 98.1, blood pressure 144/46, satting 94% on room air, respiratory rate is 18. GENERAL:  She is alert, but she has got some mild confusion. HEENT:  Oropharynx is extremely dry mouth. NECK:  No JVD.  No carotid bruits. CARDIAC:  Regular. PULMONARY:  Clear. ABDOMEN:  Soft and obese. EXTREMITIES:  Right knee is currently in a brace.  Her legs in pain.  LABORATORY DATA:  BMET shows sodium 137, potassium 4.1, chloride 98, bicarb is 35, BUN is 42, creatinine of 2.22, and glucose is 154.  CBC shows white count 7.7, hemoglobin 10.2, and platelet count 272.  Her last creatinine was 1.7 on July 5th.  ASSESSMENT:  This is an elderly woman with severe osteoporosis who slid and fell out of a lift chair tonight and her knee x-ray shows a mildly impacted fracture of the right lateral femoral metadiaphysis with joint effusions.  She is status [name] total knee arthroplasty and no adverse hardware features.  Her hip x-ray showed no acute fracture or dislocation.  She needs to be admitted because she is currently dehydrated, currently in pain, she has failure-to-thrive, and unable to care for herself at home.  PLAN: 1. Admit. 2. We will get physical and occupational therapy and case management     for admission to skilled nursing facility.  She will need pain     control.  She will be toe  touch weightbearing with knee     immobilizer. 3. She will have knee immobilization. 4. She will need some osteoporosis type treatment and question Forteo. 5. Foley catheter will be placed and urinalysis will be obtained to     see if she is currently on an another urinary tract infection. 6. Home medications were available and appropriate. 7. We will decrease blood pressure medicines, hold Lasix, and give     slight hydration as she is dehydrated at this current time. 8. Follow up on her CBC and iron profile as she is anemic. 9. Heme check stool x1. 10.Check EKG x1. 11.Follow up labs in the morning. 12.Follow up on TSH. 13.Hold any brain altering drugs as she has some slight confusion and     for that we will check for an urinary tract infection and again be  careful with her narcotic pain medications. She has got a recent     diagnosis of dementia and try to know if that is an accurate     diagnosis at this current time. 14.She recently saw Dr. Retta Diones and is being followed by     postmenopausal atrophic vaginitis, urinary tract infections,     vaginitis, and recent encephalopathy related to the urinary tract     infections.  He wanted to go ahead     and start her on Estrace cream and consider antibiotic prophylaxis     and potentially doxy 100 mg nightly for approximately 2 months.  We     will let Carrie [name] review the medication list and verify whether he     wants to continue this at this current time.     Carrie Pounds, MD     JMR/MEDQ  D:  09/09/2010  T:  09/09/2010  Job:  [id number]  cc:   Carrie [name], M.D. Fax: [phone]  Carrie Post, MD Fax: [phone]  Bertram Millard. Dahlstedt, M.D. Fax: [phone]  Electronically Signed by Creola Corn MD on 09/19/2010 08:31:26 PM

## 2010-12-06 ENCOUNTER — Inpatient Hospital Stay (HOSPITAL_COMMUNITY)
Admission: EM | Admit: 2010-12-06 | Discharge: 2010-12-08 | DRG: 292 | Disposition: A | Discharge status: Nursing Home (Includes ICF) | Payer: Medicare Other | Attending: Cardiology | Admitting: Cardiology

## 2010-12-06 ENCOUNTER — Emergency Department (HOSPITAL_COMMUNITY): Payer: Medicare Other

## 2010-12-06 DIAGNOSIS — I5031 Acute diastolic (congestive) heart failure: Principal | ICD-10-CM | POA: Diagnosis present

## 2010-12-06 DIAGNOSIS — M109 Gout, unspecified: Secondary | ICD-10-CM | POA: Diagnosis present

## 2010-12-06 DIAGNOSIS — R5381 Other malaise: Secondary | ICD-10-CM | POA: Diagnosis present

## 2010-12-06 DIAGNOSIS — N184 Chronic kidney disease, stage 4 (severe): Secondary | ICD-10-CM | POA: Diagnosis present

## 2010-12-06 DIAGNOSIS — E785 Hyperlipidemia, unspecified: Secondary | ICD-10-CM | POA: Diagnosis present

## 2010-12-06 DIAGNOSIS — R627 Adult failure to thrive: Secondary | ICD-10-CM | POA: Diagnosis present

## 2010-12-06 DIAGNOSIS — E039 Hypothyroidism, unspecified: Secondary | ICD-10-CM | POA: Diagnosis present

## 2010-12-06 DIAGNOSIS — D649 Anemia, unspecified: Secondary | ICD-10-CM | POA: Diagnosis present

## 2010-12-06 DIAGNOSIS — Z7982 Long term (current) use of aspirin: Secondary | ICD-10-CM

## 2010-12-06 DIAGNOSIS — Z79899 Other long term (current) drug therapy: Secondary | ICD-10-CM

## 2010-12-06 DIAGNOSIS — Z96659 Presence of unspecified artificial knee joint: Secondary | ICD-10-CM

## 2010-12-06 DIAGNOSIS — Z9849 Cataract extraction status, unspecified eye: Secondary | ICD-10-CM

## 2010-12-06 DIAGNOSIS — E669 Obesity, unspecified: Secondary | ICD-10-CM | POA: Diagnosis present

## 2010-12-06 DIAGNOSIS — E876 Hypokalemia: Secondary | ICD-10-CM | POA: Diagnosis not present

## 2010-12-06 DIAGNOSIS — I129 Hypertensive chronic kidney disease with stage 1 through stage 4 chronic kidney disease, or unspecified chronic kidney disease: Secondary | ICD-10-CM | POA: Diagnosis present

## 2010-12-06 DIAGNOSIS — I509 Heart failure, unspecified: Secondary | ICD-10-CM | POA: Diagnosis present

## 2010-12-06 DIAGNOSIS — F3289 Other specified depressive episodes: Secondary | ICD-10-CM | POA: Diagnosis present

## 2010-12-06 DIAGNOSIS — M810 Age-related osteoporosis without current pathological fracture: Secondary | ICD-10-CM | POA: Diagnosis present

## 2010-12-06 DIAGNOSIS — G8929 Other chronic pain: Secondary | ICD-10-CM | POA: Diagnosis present

## 2010-12-06 DIAGNOSIS — I2510 Atherosclerotic heart disease of native coronary artery without angina pectoris: Secondary | ICD-10-CM | POA: Diagnosis present

## 2010-12-06 DIAGNOSIS — F329 Major depressive disorder, single episode, unspecified: Secondary | ICD-10-CM | POA: Diagnosis present

## 2010-12-06 DIAGNOSIS — I452 Bifascicular block: Secondary | ICD-10-CM | POA: Diagnosis present

## 2010-12-06 DIAGNOSIS — R0602 Shortness of breath: Secondary | ICD-10-CM

## 2010-12-06 DIAGNOSIS — Z9981 Dependence on supplemental oxygen: Secondary | ICD-10-CM

## 2010-12-06 DIAGNOSIS — M1990 Unspecified osteoarthritis, unspecified site: Secondary | ICD-10-CM | POA: Diagnosis present

## 2010-12-06 DIAGNOSIS — E1169 Type 2 diabetes mellitus with other specified complication: Secondary | ICD-10-CM | POA: Diagnosis present

## 2010-12-06 DIAGNOSIS — Z9861 Coronary angioplasty status: Secondary | ICD-10-CM

## 2010-12-06 LAB — BASIC METABOLIC PANEL
BUN: 31 mg/dL — ABNORMAL HIGH (ref 6–23)
CO2: 28 mEq/L (ref 19–32)
Calcium: 9.6 mg/dL (ref 8.4–10.5)
Chloride: 103 mEq/L (ref 96–112)
Creatinine, Ser: 1.97 mg/dL — ABNORMAL HIGH (ref 0.50–1.10)
GFR calc Af Amer: 27 mL/min — ABNORMAL LOW (ref >90)
GFR calc non Af Amer: 23 mL/min — ABNORMAL LOW (ref >90)
Glucose, Bld: 128 mg/dL — ABNORMAL HIGH (ref 70–99)
Potassium: 4.1 mEq/L (ref 3.5–5.1)
Sodium: 140 mEq/L (ref 135–145)

## 2010-12-06 LAB — GLUCOSE, CAPILLARY
Glucose-Capillary: 101 mg/dL — ABNORMAL HIGH (ref 70–99)
Glucose-Capillary: 134 mg/dL — ABNORMAL HIGH (ref 70–99)

## 2010-12-06 LAB — POCT I-STAT TROPONIN I
Troponin i, poc: 0.12 ng/mL (ref 0.00–0.08)
Troponin i, poc: 0.11 ng/mL (ref 0.00–0.08)

## 2010-12-06 LAB — DIFFERENTIAL
Basophils Absolute: 0.1 10*3/uL (ref 0.0–0.1)
Basophils Relative: 1 % (ref 0–1)
Eosinophils Absolute: 0.2 10*3/uL (ref 0.0–0.7)
Eosinophils Relative: 3 % (ref 0–5)
Lymphocytes Relative: 15 % (ref 12–46)
Lymphs Abs: 1.2 10*3/uL (ref 0.7–4.0)
Monocytes Absolute: 0.4 10*3/uL (ref 0.1–1.0)
Monocytes Relative: 5 % (ref 3–12)
Neutro Abs: 6 10*3/uL (ref 1.7–7.7)
Neutrophils Relative %: 76 % (ref 43–77)

## 2010-12-06 LAB — CBC
HCT: 28.4 % — ABNORMAL LOW (ref 36.0–46.0)
Hemoglobin: 9.4 g/dL — ABNORMAL LOW (ref 12.0–15.0)
MCH: 31 pg (ref 26.0–34.0)
MCHC: 33.1 g/dL (ref 30.0–36.0)
MCV: 93.7 fL (ref 78.0–100.0)
Platelets: 262 10*3/uL (ref 150–400)
RBC: 3.03 MIL/uL — ABNORMAL LOW (ref 3.87–5.11)
RDW: 17.7 % — ABNORMAL HIGH (ref 11.5–15.5)
WBC: 7.8 10*3/uL (ref 4.0–10.5)

## 2010-12-06 LAB — PRO B NATRIURETIC PEPTIDE: Pro B Natriuretic peptide (BNP): 8004 pg/mL — ABNORMAL HIGH (ref 0–450)

## 2010-12-07 LAB — BASIC METABOLIC PANEL
BUN: 32 mg/dL — ABNORMAL HIGH (ref 6–23)
CO2: 30 mEq/L (ref 19–32)
Chloride: 103 mEq/L (ref 96–112)
Creatinine, Ser: 2.19 mg/dL — ABNORMAL HIGH (ref 0.50–1.10)

## 2010-12-07 LAB — LIPID PANEL
Cholesterol: 215 mg/dL — ABNORMAL HIGH (ref 0–200)
HDL: 36 mg/dL — ABNORMAL LOW (ref >39)
Total CHOL/HDL Ratio: 6 RATIO
VLDL: 26 mg/dL (ref 0–40)

## 2010-12-07 LAB — GLUCOSE, CAPILLARY
Glucose-Capillary: 143 mg/dL — ABNORMAL HIGH (ref 70–99)
Glucose-Capillary: 119 mg/dL — ABNORMAL HIGH (ref 70–99)

## 2010-12-07 LAB — MRSA PCR SCREENING: MRSA by PCR: NEGATIVE

## 2010-12-08 LAB — BASIC METABOLIC PANEL
BUN: 35 mg/dL — ABNORMAL HIGH (ref 6–23)
CO2: 29 mEq/L (ref 19–32)
Chloride: 103 mEq/L (ref 96–112)
GFR calc Af Amer: 23 mL/min — ABNORMAL LOW (ref >90)
Potassium: 4.3 mEq/L (ref 3.5–5.1)

## 2010-12-08 LAB — GLUCOSE, CAPILLARY: Glucose-Capillary: 94 mg/dL (ref 70–99)

## 2010-12-08 LAB — PRO B NATRIURETIC PEPTIDE: Pro B Natriuretic peptide (BNP): 5752 pg/mL — ABNORMAL HIGH (ref 0–450)

## 2010-12-13 NOTE — Discharge Summary (Signed)
Carrie [name], Carrie [name] NO.:  [id number]  MEDICAL RECORD NO.:  [id number]  LOCATION:  4715                         FACILITY:  [facility]  PHYSICIAN:  Jake Bathe, MD      DATE OF BIRTH:  June 10, 1929  DATE OF ADMISSION:  12/06/2010 DATE OF DISCHARGE:  12/08/2010                              DISCHARGE SUMMARY   PRIMARY CARE PHYSICIAN:  Kari Baars, MD  CARDIOLOGIST:  Corky Crafts, MD, [facility].  Former patient of Dr. Amil Amen.  FINAL DIAGNOSES: 1. Shortness of breath - acute diastolic heart failure. 2. Obesity. 3. Diabetes. 4. Hypertension. 5. Coronary artery disease, prior percutaneous coronary interventions. 6. Anemia. 7. Failure to thrive. 8. Chronic stage 4 kidney disease. 9. Hypothyroidism. 10.Multiple fractures. 11.Chronic pain. 12.Gout. 13.Depression. 14.Remote history of paroxysmal atrial fibrillation, none seen on this     admission.  BRIEF HOSPITAL COURSE:  A 77 year old female who was admitted from [facility] with complaint of shortness of breath, gradual over the past few days.  She was on an increased dose of Lasix of 160 mg twice a day until recently.  This was discontinued to 40 mg once a day, likely due to worsening creatinine.  She according to H and P reported shortness of breath that was worse with lying supine, had to sleep upright and had progressive lower extremity edema, but she denied any chest pain or nausea or vomiting.  When I talked to her the next morning and I asked her why she was here in the hospital, she did not know.  She stated that they picked her up and took her in.  She asked me that once again on the morning of discharge.  She was breathing better, feeling fine, and stated that she was fine in order to go home to her facility.  VITAL SIGNS ON DISCHARGE:  Blood pressure is 132/80-155/65, temperature is 98.3, T-max 99.4, pulses 65.  She is currently satting 93% on 4 L. As far ins and  outs, she is negative approximately 2.5 L throughout this admission and her weight is 94.6 kg on discharge, 97.7 on admission.  DISCHARGE LABS:  Her BNP on discharge showed a sodium of 141, potassium 4.3, BUN 35, creatinine 2.27.  On admission, her creatinine was 1.97. Her BNP on discharge was 5752, on at admit was 8004.  Chest x-ray was showing small bilateral effusions and possible density in the right base, somewhat mass-like configuration.  Chest x-ray will need to be followed up at some point according to the Radiology.  I did reserve this discretion to Dr. Buren Kos, her primary physician.  DISCHARGE MEDICATIONS: 1. Carvedilol 6.25 mg twice a day, which is a new medication. 2. Isosorbide dinitrate 20 mg twice a day, which is new. 3. Furosemide has been changed to 80 mg twice a day p.o.  All the other medications are the same: 1. Allopurinol 1 mg daily. 2. Alprazolam 0.5 mg 1 tablet every 8 hours as needed. 3. Amlodipine 5 mg daily. 4. Aspirin 81 mg daily. 5. Acetaminophen 325 mg 2 tablets every 4 hours as needed. 6. Calcitriol 0.25 mcg 1 capsule daily. 7. Colchicine 0.6 mg 1 tablet every  8 hours as needed. 8. Clonidine 0.1 mg 1 tablet daily. 9. Docusate sodium 100 mg by mouth daily. 10.Folgard Rx 2.2 mg/25 mg/1 mg 1 tablet by mouth daily. 11.Hydralazine 25 mg by mouth every 8 hours. 12.Lantus 30 units subcutaneously every evening. 13.Levothyroxine 100 mcg 1 tab daily. 14.Lexapro 20 mg 1 tablet daily. 15.Multivitamins 1 tablet daily. 16.Neurontin 100 mg 3 times a day. 17.Nitroglycerin 0.4 mg every 5 minutes p.r.n. chest pain. 18.Oxycodone 5 mg IR tab every 6 hours as needed. 19.Tramadol 50 mg 1 tablet every 8 hours as needed.  FOLLOWUP: 1. She has followup for which she is going to be scheduled, needs to     call for appointment with Dr. Buren Kos, telephone [phone],     this should be for 1 week.  At that clinic visit, I would suggest     checking a basic  metabolic profile to ensure that her creatinine     has not increased dramatically, one may also wish to repeat chest x-     ray as desired by radiologist to evaluate right lower lobe. 2. She should have an appointment with Dr. Eldridge Dace of Cardiology or     Cristopher Peru our nurse practitioner, telephone [phone] in     approximately 1-2 weeks.  She can call for appointment.  A new     prescriptions have been given.  If necessary, she may utilize home     O2 until reassessed.  Discharge time was 40 minutes spent on medications reconciliation, review with medical records, discussion with the patient, review of labs.     Jake Bathe, MD     MCS/MEDQ  D:  12/08/2010  T:  12/08/2010  Job:  [id number]  cc:   Kari Baars, M.D. Corky Crafts, MD  Electronically Signed by Donato Schultz MD on 12/13/2010 07:55:59 AM

## 2010-12-24 NOTE — H&P (Signed)
NAMEJENNIEFER, [name] NO.:  [id number]  MEDICAL RECORD NO.:  [id number]  LOCATION:  4715                         FACILITY:  [facility]  PHYSICIAN:  Hillis Range, MD       DATE OF BIRTH:  Sep 10, 1929  DATE OF ADMISSION:  12/06/2010 DATE OF DISCHARGE:                             HISTORY & PHYSICAL   CHIEF COMPLAINT:  Shortness of breath.  PRIMARY CARDIOLOGIST:  [facility] (formally Dr. Amil Amen).  PRIMARY CARE PHYSICIAN:  Kari Baars, MD  HISTORY OF PRESENT ILLNESS:  Carrie [name] is a pleasant 77 year old female with a history of diabetes, hypertension, and coronary artery disease, who presents complaining of shortness of breath.  She reports gradual, but progressive shortness of breath over the past few days. She finds that her shortness of breath is worse when lying supine and, therefore, has had to sleep upright.  She also reports progressive lower extremity edema.  She denies chest pain, arm/jaw pain, nausea or vomiting.  She denies palpitations, dizziness, presyncope, or syncope. She has had a cough which is nonproductive and denies fevers or chills. She is unaware of any precipitants of triggers for her recent shortness of breath.  She resides in assisted living facility and reports compliance with her medicine regimen.  She is admitted for further management of her shortness of breath.  PAST MEDICAL HISTORY: 1. Obesity. 2. Diabetes. 3. Hypertension. 4. Coronary artery disease.  She reports having prior PCIs, though I     do not have records presently available. 5. Anemia. 6. Degenerative joint disease with multiple prior surgeries including     a right femoral fracture following a fall in July of this year. 7. Failure to thrive. 8. Chronic stage IV renal failure. 9. Hypothyroidism. 10.Osteoporosis with multiple fractures. 11.Gout. 12.Depression. 13.There is a remote history of paroxysmal atrial fibrillation     documented.  PAST SURGICAL  HISTORY: 1. Right shoulder surgery in 2006, left total shoulder surgery in     1997. 2. Left cataract repair in 2007. 3. Right total knee replacement. 4. Cholecystectomy.  SOCIAL HISTORY:  The patient resides at [facility].  She denies tobacco, alcohol, or drug use.  FAMILY HISTORY:  Notable for hypertension.  REVIEW OF SYSTEMS:  All systems were reviewed and negative except as outlined in the HPI above.  PHYSICAL EXAMINATION:  VITALS:  Blood pressure 179/67, heart rate 65, respirations 17, sats 99% on 2 L. GENERAL:  The patient is an elderly and obese female in no acute distress.  She is alert and oriented. HEENT:  Normocephalic, atraumatic.  Sclerae clear.  Conjunctivae pink. Oropharynx clear. NECK:  Supple.  JVP 10 cm. LUNGS:  Bibasilar rales are notable. HEART:  Regular rate and rhythm, 2/6 systolic ejection murmur along the left upper sternal border which is early peaking. GI:  Soft, nontender, nondistended.  Positive bowel sounds. EXTREMITIES:  No clubbing, cyanosis.  She does have 1+ bilateral lower extremity edema bilaterally. SKIN:  No ecchymoses or lacerations. MUSCULOSKELETAL:  She has diffuse muscle atrophy. PSYCHIATRIC:  Euthymic mood.  Full affect. NEUROLOGIC:  Strength and sensation appear to be intact.  DIAGNOSTIC STUDIES:  EKG reveals sinus rhythm with a right bundle-branch  left anterior hemiblock with nonspecific ST/T-wave changes.  Chest x-ray is reviewed and reveals cardiomegaly with moderate pulmonary edema and bilateral small effusions.  Calcification of the trachea and aorta are also appreciated.  LABORATORY DATA:  Troponin 0.11, BNP 8000.  Potassium 4.1, creatinine 1.8.  Hematocrit 28, white blood cell count 7.8, platelets 262.  IMPRESSION:  Carrie [name] is a pleasant 77 year old female with multiple comorbidities as outlined above, who now presents with acute on chronic shortness of breath.  She is chronically debilitated and has a  poor functional status.  She has chronic stage IV renal failure as well as diabetes and poorly controlled hypertension.  She has previously been a patient of Dr. Amil Amen', but does not appear to have had recent cardiac evaluation.  She is now admitted with acute decompensated congestive heart failure.  PLAN:  The patient is admitted to telemetry for close management of her congestive heart failure.  We will obtain an echocardiogram to evaluate her ejection fraction.  She has been placed on intravenous Lasix and we will follow strict I's and O's and daily weights.  In addition, we will have to follow her creatinine closely as she has stage IV renal failure chronically.  ACE inhibitors are, therefore, on hold.  She has no symptoms of ischemia at this time.  She has a very small elevation in her troponin which we will follow.  I think that she is not a candidate for catheterization given her advanced age and renal failure.  We will, therefore, manage her medically.  We will continue aspirin and add carvedilol.  Given bifascicular block, I think that we will have to be cautious as we titrate her Coreg.  We will place her on her homemedicine regimen for diabetes and follow closely.  Her blood pressure is very elevated today and as we titrate her heart failure medicines, hopefully her blood pressure will improve.  Once her creatinine is stable, we may be able to add an ACE inhibitor or possibly spironolactone at that time.  Sodium restriction is also necessary.  Physical Therapy has been consulted due to the patient's chronic debility.  She will be followed by [facility] during her hospital stay.     Hillis Range, MD     JA/MEDQ  D:  12/06/2010  T:  12/07/2010  Job:  [id number]  cc:   Jake Bathe, MD Kari Baars, M.D.  Electronically Signed by Hillis Range MD on 12/24/2010 02:34:16 PM

## 2011-01-22 ENCOUNTER — Other Ambulatory Visit (HOSPITAL_COMMUNITY): Payer: Self-pay | Admitting: *Deleted

## 2011-01-26 ENCOUNTER — Encounter (HOSPITAL_COMMUNITY)
Admission: RE | Admit: 2011-01-26 | Discharge: 2011-01-26 | Disposition: A | Discharge status: Home / Self Care | Payer: Medicare Other | Source: Ambulatory Visit | Attending: Nephrology | Admitting: Nephrology

## 2011-01-26 DIAGNOSIS — D509 Iron deficiency anemia, unspecified: Secondary | ICD-10-CM | POA: Insufficient documentation

## 2011-01-26 MED ORDER — FERUMOXYTOL INJECTION 510 MG/17 ML
INTRAVENOUS | Status: AC
  Administered 2011-01-26: 510 mg via INTRAVENOUS
  Filled 2011-01-26: qty 17

## 2011-01-26 MED ORDER — FERUMOXYTOL INJECTION 510 MG/17 ML
510.0000 mg | INTRAVENOUS | Status: DC
  Administered 2011-01-26: 510 mg via INTRAVENOUS

## 2011-02-03 ENCOUNTER — Encounter (HOSPITAL_COMMUNITY)
Admission: RE | Admit: 2011-02-03 | Discharge: 2011-02-03 | Disposition: A | Discharge status: Home / Self Care | Payer: Medicare Other | Source: Ambulatory Visit | Attending: Nephrology | Admitting: Nephrology

## 2011-02-03 MED ORDER — FERUMOXYTOL INJECTION 510 MG/17 ML
510.0000 mg | INTRAVENOUS | Status: AC
  Administered 2011-02-03: 510 mg via INTRAVENOUS

## 2011-02-03 MED ORDER — FERUMOXYTOL INJECTION 510 MG/17 ML
INTRAVENOUS | Status: AC
  Administered 2011-02-03: 510 mg via INTRAVENOUS
  Filled 2011-02-03: qty 17

## 2011-04-01 ENCOUNTER — Emergency Department (HOSPITAL_BASED_OUTPATIENT_CLINIC_OR_DEPARTMENT_OTHER)
Admission: EM | Admit: 2011-04-01 | Discharge: 2011-04-01 | Disposition: A | Discharge status: Home / Self Care | Payer: Medicare Other | Attending: Emergency Medicine | Admitting: Emergency Medicine

## 2011-04-01 ENCOUNTER — Other Ambulatory Visit: Payer: Self-pay

## 2011-04-01 ENCOUNTER — Encounter (HOSPITAL_BASED_OUTPATIENT_CLINIC_OR_DEPARTMENT_OTHER): Payer: Self-pay

## 2011-04-01 DIAGNOSIS — F0390 Unspecified dementia without behavioral disturbance: Secondary | ICD-10-CM | POA: Insufficient documentation

## 2011-04-01 DIAGNOSIS — R1110 Vomiting, unspecified: Secondary | ICD-10-CM | POA: Insufficient documentation

## 2011-04-01 DIAGNOSIS — E079 Disorder of thyroid, unspecified: Secondary | ICD-10-CM | POA: Insufficient documentation

## 2011-04-01 DIAGNOSIS — M810 Age-related osteoporosis without current pathological fracture: Secondary | ICD-10-CM | POA: Insufficient documentation

## 2011-04-01 DIAGNOSIS — Z79899 Other long term (current) drug therapy: Secondary | ICD-10-CM | POA: Insufficient documentation

## 2011-04-01 DIAGNOSIS — E860 Dehydration: Secondary | ICD-10-CM | POA: Insufficient documentation

## 2011-04-01 DIAGNOSIS — I10 Essential (primary) hypertension: Secondary | ICD-10-CM | POA: Insufficient documentation

## 2011-04-01 DIAGNOSIS — E876 Hypokalemia: Secondary | ICD-10-CM

## 2011-04-01 DIAGNOSIS — I2510 Atherosclerotic heart disease of native coronary artery without angina pectoris: Secondary | ICD-10-CM | POA: Insufficient documentation

## 2011-04-01 HISTORY — DX: Unspecified background retinopathy: H35.00

## 2011-04-01 HISTORY — DX: Disorder of thyroid, unspecified: E07.9

## 2011-04-01 HISTORY — DX: Unspecified osteoarthritis, unspecified site: M19.90

## 2011-04-01 HISTORY — DX: Hyperlipidemia, unspecified: E78.5

## 2011-04-01 HISTORY — DX: Polyneuropathy, unspecified: G62.9

## 2011-04-01 HISTORY — DX: Essential (primary) hypertension: I10

## 2011-04-01 HISTORY — DX: Unspecified dementia, unspecified severity, without behavioral disturbance, psychotic disturbance, mood disturbance, and anxiety: F03.90

## 2011-04-01 HISTORY — DX: Atherosclerotic heart disease of native coronary artery without angina pectoris: I25.10

## 2011-04-01 LAB — BASIC METABOLIC PANEL
BUN: 106 mg/dL — ABNORMAL HIGH (ref 6–23)
BUN: 102 mg/dL — ABNORMAL HIGH (ref 6–23)
CO2: 37 mEq/L — ABNORMAL HIGH (ref 19–32)
CO2: 35 mEq/L — ABNORMAL HIGH (ref 19–32)
Chloride: 91 mEq/L — ABNORMAL LOW (ref 96–112)
Creatinine, Ser: 3.8 mg/dL — ABNORMAL HIGH (ref 0.50–1.10)
Glucose, Bld: 130 mg/dL — ABNORMAL HIGH (ref 70–99)
Glucose, Bld: 161 mg/dL — ABNORMAL HIGH (ref 70–99)
Potassium: 3 mEq/L — ABNORMAL LOW (ref 3.5–5.1)
Potassium: 3.2 mEq/L — ABNORMAL LOW (ref 3.5–5.1)
Sodium: 137 mEq/L (ref 135–145)

## 2011-04-01 LAB — CBC
HCT: 30.4 % — ABNORMAL LOW (ref 36.0–46.0)
Hemoglobin: 10.2 g/dL — ABNORMAL LOW (ref 12.0–15.0)
MCHC: 33.6 g/dL (ref 30.0–36.0)
RBC: 3.24 MIL/uL — ABNORMAL LOW (ref 3.87–5.11)

## 2011-04-01 MED ORDER — POTASSIUM CHLORIDE CRYS ER 20 MEQ PO TBCR
40.0000 meq | EXTENDED_RELEASE_TABLET | Freq: Once | ORAL | Status: AC
  Administered 2011-04-01: 40 meq via ORAL
  Filled 2011-04-01: qty 2

## 2011-04-01 MED ORDER — SODIUM CHLORIDE 0.9 % IV BOLUS (SEPSIS)
250.0000 mL | Freq: Once | INTRAVENOUS | Status: AC
  Administered 2011-04-01: 250 mL via INTRAVENOUS

## 2011-04-01 MED ORDER — ONDANSETRON HCL 4 MG/2ML IJ SOLN
4.0000 mg | Freq: Once | INTRAMUSCULAR | Status: AC
  Administered 2011-04-01: 4 mg via INTRAVENOUS
  Filled 2011-04-01: qty 2

## 2011-04-01 MED ORDER — SODIUM CHLORIDE 0.9 % IV BOLUS (SEPSIS)
500.0000 mL | Freq: Once | INTRAVENOUS | Status: AC
  Administered 2011-04-01: 1000 mL via INTRAVENOUS

## 2011-04-01 MED ORDER — SODIUM CHLORIDE 0.9 % IV SOLN: INTRAVENOUS | Status: DC

## 2011-04-01 NOTE — ED Notes (Signed)
Everything explained to son and a copy of discharge instructions

## 2011-04-01 NOTE — ED Provider Notes (Signed)
History     CSN: [id number]  Arrival date & time 04/01/11  1624   First MD Initiated Contact with Patient 04/01/11 1625      Chief Complaint  Patient presents with  . Emesis    (Consider location/radiation/quality/duration/timing/severity/associated sxs/prior treatment) Patient is a 78 y.o. female presenting with vomiting. The history is provided by the nursing home and the EMS personnel. The history is limited by the condition of the patient (History of dementia).  Emesis  This is a new problem. The current episode started 6 to 12 hours ago. The problem occurs 2 to 4 times per day. The problem has not changed since onset.There has been no fever. Associated symptoms include myalgias. Pertinent negatives include no abdominal pain, no chills, no cough, no diarrhea, no fever, no headaches and no URI.   Patient from assisted living [facility]. Reports 3 episodes of vomiting today in the morning.  Also a complaint of right shoulder pain which is baseline for her. History of dementia.  Past Medical History  Diagnosis Date  . Dementia   . Hypertension   . Thyroid disease   . CAD (coronary artery disease)   . Neuropathy   . Hyperlipidemia   . Arthritis   . Osteoporosis   . Retinopathy     No past surgical history on file.  No family history on file.  History  Substance Use Topics  . Smoking status: Unknown If Ever Smoked  . Smokeless tobacco: Never Used  . Alcohol Use: No    OB History    Grav Para Term Preterm Abortions TAB SAB Ect Mult Living                  Review of Systems  Constitutional: Negative for fever and chills.  HENT: Negative for congestion and neck pain.   Eyes: Negative for redness.  Respiratory: Negative for cough.   Gastrointestinal: Positive for vomiting. Negative for abdominal pain and diarrhea.  Genitourinary: Negative for dysuria.  Musculoskeletal: Positive for myalgias. Negative for back pain.  Skin: Negative for rash.  Neurological:  Negative for headaches.  Hematological: Does not bruise/bleed easily.  Psychiatric/Behavioral: Positive for confusion.   review of systems somewhat limited by the patient's history of dementia.  Allergies  Codeine; Hydrocodone; Penicillins; and Sulfa antibiotics  Home Medications   Current Outpatient Rx  Name Route Sig Dispense Refill  . ACETAMINOPHEN 325 MG PO TABS Oral Take 650 mg by mouth every 4 (four) hours as needed. For pain    . ALLOPURINOL 100 MG PO TABS Oral Take 100 mg by mouth daily.    Marland Kitchen AMLODIPINE BESYLATE 5 MG PO TABS Oral Take 5 mg by mouth daily.      . ASPIRIN 81 MG PO CHEW Oral Chew 81 mg by mouth daily.    Marland Kitchen CALCITRIOL 0.25 MCG PO CAPS Oral Take 0.25 mcg by mouth daily.      Marland Kitchen CAMPHOR-MENTHOL 0.2-3.5 % EX GEL Apply externally Apply 1 application topically 3 (three) times daily as needed. For knee pain    . CARVEDILOL 6.25 MG PO TABS Oral Take 6.25 mg by mouth 2 (two) times daily with a meal. At 8 am and 5 pm    . CLONIDINE HCL 0.1 MG PO TABS Oral Take 0.1 mg by mouth 2 (two) times daily. At 9 am and 8 pm    . COLCHICINE 0.6 MG PO TABS Oral Take 0.6 mg by mouth every 8 (eight) hours as needed. For gout    .  DOCUSATE SODIUM 100 MG PO CAPS Oral Take 100 mg by mouth 2 (two) times daily.      Marland Kitchen ESCITALOPRAM OXALATE 20 MG PO TABS Oral Take 20 mg by mouth at bedtime.    Marland Kitchen FOLIC ACID-VIT B6-VIT B12 2.2-25-1 MG PO TABS Oral Take 1 tablet by mouth daily.    . FUROSEMIDE 80 MG PO TABS Oral Take 160 mg by mouth 2 (two) times daily. At 9 am and 9 pm    . GABAPENTIN 100 MG PO CAPS Oral Take 100 mg by mouth 3 (three) times daily.      Marland Kitchen HYDRALAZINE HCL 25 MG PO TABS Oral Take 25 mg by mouth every 8 (eight) hours.      . INSULIN GLARGINE 100 UNIT/ML [place] SOLN Subcutaneous Inject 30 Units into the skin at bedtime.    . ISOSORBIDE DINITRATE 20 MG PO TABS Oral Take 20 mg by mouth 2 (two) times daily.      Marland Kitchen LEVOTHYROXINE SODIUM 100 MCG PO TABS Oral Take 100 mcg by mouth daily.    Marland Kitchen  METOLAZONE 2.5 MG PO TABS Oral Take 2.5 mg by mouth 2 (two) times a week. On Tuesday and Friday    . ADULT MULTIVITAMIN W/MINERALS CH Oral Take 1 tablet by mouth daily.    Marland Kitchen NITROGLYCERIN 0.4 MG SL SUBL Sublingual Place 0.4 mg under the tongue every 5 (five) minutes as needed. For 3 doses for chest pain.  If no relief, call doctor    . OMEPRAZOLE 20 MG PO CPDR Oral Take 20 mg by mouth daily.      . OXYCODONE HCL 5 MG PO TABS Oral Take 5 mg by mouth every 6 (six) hours as needed. For moderate to severe pain    . POLYETHYLENE GLYCOL 3350 PO POWD Oral Take 17 g by mouth daily as needed. For constipation    . TRAMADOL HCL 50 MG PO TABS Oral Take 50 mg by mouth every 6 (six) hours as needed. For moderate pain      BP 147/38  Pulse 52  Temp(Src) 97.6 F (36.4 C) (Oral)  Resp 16  SpO2 100%  Physical Exam  Nursing note and vitals reviewed. Constitutional: She appears well-developed. No distress.  HENT:  Head: Normocephalic and atraumatic.       Mucous membranes dry lips chapped  Eyes: Conjunctivae are normal. Pupils are equal, round, and reactive to light.  Neck: Normal range of motion. Neck supple.  Cardiovascular: Normal rate, regular rhythm, normal heart sounds and intact distal pulses.   No murmur heard.      Bradycardia  Pulmonary/Chest: Effort normal and breath sounds normal. No respiratory distress. She has no wheezes. She has no rales.  Abdominal: Soft. Bowel sounds are normal. There is no tenderness.  Musculoskeletal: Normal range of motion. She exhibits no edema.  Neurological: She is alert.       Cooperative moving all 4 extremities  Skin: Skin is warm. No rash noted. She is not diaphoretic.    ED Course  Procedures (including critical care time)  Labs Reviewed  CBC - Abnormal; Notable for the following:    RBC 3.24 (*)    Hemoglobin 10.2 (*)    HCT 30.4 (*)    RDW 17.2 (*)    All other components within normal limits  BASIC METABOLIC PANEL - Abnormal; Notable for the  following:    Potassium 3.0 (*)    Chloride 88 (*)    CO2 37 (*)  Glucose, Bld 130 (*)    BUN 106 (*)    Creatinine, Ser 4.20 (*)    GFR calc non Af Amer 9 (*)    GFR calc Af Amer 11 (*)    All other components within normal limits  BASIC METABOLIC PANEL - Abnormal; Notable for the following:    Potassium 3.2 (*)    Chloride 91 (*)    CO2 35 (*)    Glucose, Bld 161 (*)    BUN 102 (*)    Creatinine, Ser 3.80 (*)    GFR calc non Af Amer 10 (*)    GFR calc Af Amer 12 (*)    All other components within normal limits   Results for orders placed during the hospital encounter of 04/01/11  CBC      Component Value Range   WBC 9.7  4.0 - 10.5 (K/uL)   RBC 3.24 (*) 3.87 - 5.11 (MIL/uL)   Hemoglobin 10.2 (*) 12.0 - 15.0 (g/dL)   HCT 78.2 (*) 95.6 - 46.0 (%)   MCV 93.8  78.0 - 100.0 (fL)   MCH 31.5  26.0 - 34.0 (pg)   MCHC 33.6  30.0 - 36.0 (g/dL)   RDW 21.3 (*) 08.6 - 15.5 (%)   Platelets 283  150 - 400 (K/uL)  BASIC METABOLIC PANEL      Component Value Range   Sodium 137  135 - 145 (mEq/L)   Potassium 3.0 (*) 3.5 - 5.1 (mEq/L)   Chloride 88 (*) 96 - 112 (mEq/L)   CO2 37 (*) 19 - 32 (mEq/L)   Glucose, Bld 130 (*) 70 - 99 (mg/dL)   BUN 578 (*) 6 - 23 (mg/dL)   Creatinine, Ser 4.69 (*) 0.50 - 1.10 (mg/dL)   Calcium 62.9  8.4 - 10.5 (mg/dL)   GFR calc non Af Amer 9 (*) >90 (mL/min)   GFR calc Af Amer 11 (*) >90 (mL/min)  BASIC METABOLIC PANEL      Component Value Range   Sodium 137  135 - 145 (mEq/L)   Potassium 3.2 (*) 3.5 - 5.1 (mEq/L)   Chloride 91 (*) 96 - 112 (mEq/L)   CO2 35 (*) 19 - 32 (mEq/L)   Glucose, Bld 161 (*) 70 - 99 (mg/dL)   BUN 528 (*) 6 - 23 (mg/dL)   Creatinine, Ser 4.13 (*) 0.50 - 1.10 (mg/dL)   Calcium 9.6  8.4 - 24.4 (mg/dL)   GFR calc non Af Amer 10 (*) >90 (mL/min)   GFR calc Af Amer 12 (*) >90 (mL/min)    Date: 04/01/2011  Rate: 51  Rhythm: sinus bradycardia  QRS Axis: normal  Intervals: normal  ST/T Wave abnormalities: nonspecific ST/T  changes  Conduction Disutrbances:right bundle branch block and left anterior fascicular block  Narrative Interpretation:   Old EKG Reviewed: unchanged No significant change since 12/06/2010 except for the sinus bradycardia.  Results for orders placed during the hospital encounter of 04/01/11  CBC      Component Value Range   WBC 9.7  4.0 - 10.5 (K/uL)   RBC 3.24 (*) 3.87 - 5.11 (MIL/uL)   Hemoglobin 10.2 (*) 12.0 - 15.0 (g/dL)   HCT 01.0 (*) 27.2 - 46.0 (%)   MCV 93.8  78.0 - 100.0 (fL)   MCH 31.5  26.0 - 34.0 (pg)   MCHC 33.6  30.0 - 36.0 (g/dL)   RDW 53.6 (*) 64.4 - 15.5 (%)   Platelets 283  150 - 400 (K/uL)  BASIC METABOLIC  PANEL      Component Value Range   Sodium 137  135 - 145 (mEq/L)   Potassium 3.0 (*) 3.5 - 5.1 (mEq/L)   Chloride 88 (*) 96 - 112 (mEq/L)   CO2 37 (*) 19 - 32 (mEq/L)   Glucose, Bld 130 (*) 70 - 99 (mg/dL)   BUN 578 (*) 6 - 23 (mg/dL)   Creatinine, Ser 4.69 (*) 0.50 - 1.10 (mg/dL)   Calcium 62.9  8.4 - 10.5 (mg/dL)   GFR calc non Af Amer 9 (*) >90 (mL/min)   GFR calc Af Amer 11 (*) >90 (mL/min)  BASIC METABOLIC PANEL      Component Value Range   Sodium 137  135 - 145 (mEq/L)   Potassium 3.2 (*) 3.5 - 5.1 (mEq/L)   Chloride 91 (*) 96 - 112 (mEq/L)   CO2 35 (*) 19 - 32 (mEq/L)   Glucose, Bld 161 (*) 70 - 99 (mg/dL)   BUN 528 (*) 6 - 23 (mg/dL)   Creatinine, Ser 4.13 (*) 0.50 - 1.10 (mg/dL)   Calcium 9.6  8.4 - 24.4 (mg/dL)   GFR calc non Af Amer 10 (*) >90 (mL/min)   GFR calc Af Amer 12 (*) >90 (mL/min)     No results found.   1. Vomiting   2. Dehydration   3. Hypokalemia       MDM   Patient from assisted living brought in for frequent episodes of vomiting today no diarrhea. Clinically patient was dehydrated electrolytes show dehydration as well. Discussion with the patient's son states that she has had trouble with dehydration in the past even without a vomiting illness. Clinically I was suspicious that some of this dehydration and  hypokalemia was existing perhaps prior to the acute vomiting illness. Patient received IV fluids in the emergency department also given food and drinks readily drank it readily ate. No further vomiting after one dose of IV Zofran. SPECT a gastritis-type illness has resolved. Repeat electrolytes after fluid and food is showing an improvement in the electrolytes. Patient also given 40 mEq of potassium by mouth. Based on her elevated creatinine we'll not send home on oral potassium but recommend repeat check of electrolytes 2 days.  Patient is showing remarkable improvement in the emergency department good color x severity and showing a good appetite and thirst.          Shelda Jakes, MD 04/01/11 2042

## 2011-04-01 NOTE — ED Notes (Signed)
Pt reports vomiting several times today.

## 2011-05-06 ENCOUNTER — Encounter (HOSPITAL_BASED_OUTPATIENT_CLINIC_OR_DEPARTMENT_OTHER): Payer: Self-pay | Admitting: Emergency Medicine

## 2011-05-06 ENCOUNTER — Emergency Department (HOSPITAL_BASED_OUTPATIENT_CLINIC_OR_DEPARTMENT_OTHER)
Admission: EM | Admit: 2011-05-06 | Discharge: 2011-05-06 | Disposition: A | Discharge status: Home / Self Care | Payer: Medicare Other | Attending: Emergency Medicine | Admitting: Emergency Medicine

## 2011-05-06 DIAGNOSIS — F0390 Unspecified dementia without behavioral disturbance: Secondary | ICD-10-CM | POA: Insufficient documentation

## 2011-05-06 DIAGNOSIS — E119 Type 2 diabetes mellitus without complications: Secondary | ICD-10-CM | POA: Insufficient documentation

## 2011-05-06 DIAGNOSIS — I10 Essential (primary) hypertension: Secondary | ICD-10-CM

## 2011-05-06 DIAGNOSIS — I2510 Atherosclerotic heart disease of native coronary artery without angina pectoris: Secondary | ICD-10-CM | POA: Insufficient documentation

## 2011-05-06 DIAGNOSIS — M810 Age-related osteoporosis without current pathological fracture: Secondary | ICD-10-CM | POA: Insufficient documentation

## 2011-05-06 DIAGNOSIS — Z794 Long term (current) use of insulin: Secondary | ICD-10-CM | POA: Insufficient documentation

## 2011-05-06 DIAGNOSIS — E785 Hyperlipidemia, unspecified: Secondary | ICD-10-CM | POA: Insufficient documentation

## 2011-05-06 LAB — BASIC METABOLIC PANEL WITH GFR
BUN: 48 mg/dL — ABNORMAL HIGH (ref 6–23)
CO2: 31 meq/L (ref 19–32)
Calcium: 10.1 mg/dL (ref 8.4–10.5)
Chloride: 91 meq/L — ABNORMAL LOW (ref 96–112)
Creatinine, Ser: 3 mg/dL — ABNORMAL HIGH (ref 0.50–1.10)
GFR calc Af Amer: 16 mL/min — ABNORMAL LOW
GFR calc non Af Amer: 14 mL/min — ABNORMAL LOW
Glucose, Bld: 404 mg/dL — ABNORMAL HIGH (ref 70–99)
Potassium: 3.1 meq/L — ABNORMAL LOW (ref 3.5–5.1)
Sodium: 136 meq/L (ref 135–145)

## 2011-05-06 LAB — CBC
Hemoglobin: 11.9 g/dL — ABNORMAL LOW (ref 12.0–15.0)
MCH: 32.5 pg (ref 26.0–34.0)
RBC: 3.66 MIL/uL — ABNORMAL LOW (ref 3.87–5.11)

## 2011-05-06 NOTE — Discharge Instructions (Signed)
Blood pressure elevated here in the emergency department in no acute emergency. Blood sugar elevated at 400 but no signs of acidosis no signs of renal insufficiency. Recommend blood pressure be controlled slowly by first rechecking blood pressure over several days and then have nursing home physician adjust hypertensive meds as necessary. Patient will need her evening dose of insulin upon return to the nursing facility if she's able to get dinner. No reason for acute intervention of the blood pressure today.

## 2011-05-06 NOTE — ED Notes (Signed)
Pt to room 1 by ems via stretcher. Per ems, they were called to pt residence at [facility] to transport pt to ed due to "high bp", per [facility] staff bp 216/81, per ems bp 208/78. Pt is awake and alert, cheerful and cooperative with care, denies any c/o. ems reports pt stated she had not had her accuck today and for them her blood sugar was 501.

## 2011-05-06 NOTE — ED Provider Notes (Signed)
History     CSN: [id number]  Arrival date & time 05/06/11  4098   First MD Initiated Contact with Patient 05/06/11 1838      Chief Complaint  Patient presents with  . Hypertension    (Consider location/radiation/quality/duration/timing/severity/associated sxs/prior treatment) The history is provided by the nursing home.   patient is a 78 year old female sent from nursing home for elevated blood pressure. Level V caveat applies to the patient's dementia blood pressure at the nursing home was 216/81 brought in by EMS patient was awake and alert no complaints nontoxic blood sugar by them was 501 reports at nursing home did not give patient. Evening dose of insulin.  Past Medical History  Diagnosis Date  . Dementia   . Hypertension   . Thyroid disease   . CAD (coronary artery disease)   . Neuropathy   . Hyperlipidemia   . Arthritis   . Osteoporosis   . Retinopathy     History reviewed. No pertinent past surgical history.  No family history on file.  History  Substance Use Topics  . Smoking status: Unknown If Ever Smoked  . Smokeless tobacco: Never Used  . Alcohol Use: No    OB History    Grav Para Term Preterm Abortions TAB SAB Ect Mult Living                  Review of Systems  Unable to perform ROS  level V caveat applies to the patient's dementia and baseline confusion  Allergies  Codeine; Hydrocodone; Penicillins; and Sulfa antibiotics  Home Medications   Current Outpatient Rx  Name Route Sig Dispense Refill  . ALLOPURINOL 100 MG PO TABS Oral Take 100 mg by mouth daily.    Marland Kitchen AMLODIPINE BESYLATE 5 MG PO TABS Oral Take 5 mg by mouth daily.      . ASPIRIN 81 MG PO CHEW Oral Chew 81 mg by mouth daily.    Marland Kitchen CALCITRIOL 0.25 MCG PO CAPS Oral Take 0.25 mcg by mouth daily.      Marland Kitchen CARVEDILOL 6.25 MG PO TABS Oral Take 6.25 mg by mouth 2 (two) times daily with a meal. At 8 am and 5 pm    . CIPROFLOXACIN HCL 500 MG PO TABS Oral Take 500 mg by mouth 2 (two) times  daily.    Marland Kitchen CIPROFLOXACIN HCL 500 MG PO TABS Oral Take 500 mg by mouth 2 (two) times daily.    Marland Kitchen CLONIDINE HCL 0.1 MG PO TABS Oral Take 0.1 mg by mouth 2 (two) times daily. At 9 am and 8 pm    . DM-GUAIFENESIN ER 30-600 MG PO TB12 Oral Take 1 tablet by mouth every 12 (twelve) hours. Patient is given this prn.    Marland Kitchen DOCUSATE SODIUM 100 MG PO CAPS Oral Take 100 mg by mouth 2 (two) times daily.      Marland Kitchen DOXYCYCLINE HYCLATE 100 MG PO TBEC Oral Take 100 mg by mouth 2 (two) times daily.    Marland Kitchen ESCITALOPRAM OXALATE 20 MG PO TABS Oral Take 20 mg by mouth at bedtime.    Marland Kitchen FOLIC ACID-VIT B6-VIT B12 2.2-25-1 MG PO TABS Oral Take 1 tablet by mouth daily.    . FUROSEMIDE 80 MG PO TABS Oral Take 160 mg by mouth 2 (two) times daily. At 9 am and 9 pm    . GABAPENTIN 100 MG PO CAPS Oral Take 100 mg by mouth 3 (three) times daily.      Marland Kitchen HYDRALAZINE HCL 25  MG PO TABS Oral Take 25 mg by mouth every 8 (eight) hours.      . INSULIN GLARGINE 100 UNIT/ML [place] SOLN Subcutaneous Inject 30 Units into the skin at bedtime.    . ISOSORBIDE DINITRATE 20 MG PO TABS Oral Take 20 mg by mouth 2 (two) times daily.      Marland Kitchen LEVOTHYROXINE SODIUM 100 MCG PO TABS Oral Take 100 mcg by mouth daily.    . ADULT MULTIVITAMIN W/MINERALS CH Oral Take 1 tablet by mouth daily.    Marland Kitchen OMEPRAZOLE 20 MG PO CPDR Oral Take 20 mg by mouth daily.      . ACETAMINOPHEN 325 MG PO TABS Oral Take 650 mg by mouth every 4 (four) hours as needed. For pain    . CAMPHOR-MENTHOL 0.2-3.5 % EX GEL Apply externally Apply 1 application topically 3 (three) times daily as needed. For knee pain    . COLCHICINE 0.6 MG PO TABS Oral Take 0.6 mg by mouth every 8 (eight) hours as needed. For gout    . METOLAZONE 2.5 MG PO TABS Oral Take 2.5 mg by mouth 2 (two) times a week. On Tuesday and Friday    . NITROGLYCERIN 0.4 MG SL SUBL Sublingual Place 0.4 mg under the tongue every 5 (five) minutes as needed. For 3 doses for chest pain.  If no relief, call doctor    . OXYCODONE HCL 5 MG PO  TABS Oral Take 5 mg by mouth every 6 (six) hours as needed. For moderate to severe pain    . POLYETHYLENE GLYCOL 3350 PO POWD Oral Take 17 g by mouth daily as needed. For constipation    . TRAMADOL HCL 50 MG PO TABS Oral Take 50 mg by mouth every 6 (six) hours as needed. For moderate pain      BP 194/57  Pulse 70  Temp(Src) 97.8 F (36.6 C) (Oral)  Resp 20  SpO2 94%  Physical Exam  Nursing note and vitals reviewed. Constitutional: She appears well-developed and well-nourished. No distress.  HENT:  Head: Normocephalic and atraumatic.  Mouth/Throat: Oropharynx is clear and moist.  Eyes: Conjunctivae and EOM are normal. Pupils are equal, round, and reactive to light.  Neck: Normal range of motion. Neck supple.  Cardiovascular: Normal rate, regular rhythm and normal heart sounds.   No murmur heard. Pulmonary/Chest: Effort normal and breath sounds normal. No respiratory distress. She has no rales.  Abdominal: Soft. Bowel sounds are normal. There is no tenderness.  Musculoskeletal: Normal range of motion. She exhibits no edema.  Neurological: She is alert. No cranial nerve deficit. She exhibits normal muscle tone. Coordination normal.       Alert but confused  Skin: Skin is warm. No rash noted. She is not diaphoretic.    ED Course  Procedures (including critical care time)  Labs Reviewed  CBC - Abnormal; Notable for the following:    RBC 3.66 (*)    Hemoglobin 11.9 (*)    HCT 35.2 (*)    All other components within normal limits  BASIC METABOLIC PANEL - Abnormal; Notable for the following:    Potassium 3.1 (*)    Chloride 91 (*)    Glucose, Bld 404 (*)    BUN 48 (*)    Creatinine, Ser 3.00 (*)    GFR calc non Af Amer 14 (*)    GFR calc Af Amer 16 (*)    All other components within normal limits  GLUCOSE, CAPILLARY - Abnormal; Notable for the following:  Glucose-Capillary 407 (*)    All other components within normal limits   No results found. A Results for orders  placed during the hospital encounter of 05/06/11  CBC      Component Value Range   WBC 6.2  4.0 - 10.5 (K/uL)   RBC 3.66 (*) 3.87 - 5.11 (MIL/uL)   Hemoglobin 11.9 (*) 12.0 - 15.0 (g/dL)   HCT 56.2 (*) 13.0 - 46.0 (%)   MCV 96.2  78.0 - 100.0 (fL)   MCH 32.5  26.0 - 34.0 (pg)   MCHC 33.8  30.0 - 36.0 (g/dL)   RDW 86.5  78.4 - 69.6 (%)   Platelets 255  150 - 400 (K/uL)  BASIC METABOLIC PANEL      Component Value Range   Sodium 136  135 - 145 (mEq/L)   Potassium 3.1 (*) 3.5 - 5.1 (mEq/L)   Chloride 91 (*) 96 - 112 (mEq/L)   CO2 31  19 - 32 (mEq/L)   Glucose, Bld 404 (*) 70 - 99 (mg/dL)   BUN 48 (*) 6 - 23 (mg/dL)   Creatinine, Ser 2.95 (*) 0.50 - 1.10 (mg/dL)   Calcium 28.4  8.4 - 10.5 (mg/dL)   GFR calc non Af Amer 14 (*) >90 (mL/min)   GFR calc Af Amer 16 (*) >90 (mL/min)  GLUCOSE, CAPILLARY      Component Value Range   Glucose-Capillary 407 (*) 70 - 99 (mg/dL)    1. Hypertension   2. Diabetes mellitus       MDM  Patient arrived to the emergency department hypertensive no acute hypertensive emergency. Blood pressure in the emergency department systolic was 197 blood sugar was elevated 400 range patient was not given her evening dose of insulin to nursing home she is not acidotic there is no hypertensive emergency no evidence of pulmonary edema congestive heart failure 2 chest pain severe headache or evidence of a stroke. Patient with known hx of HTN and DM.        Shelda Jakes, MD 05/06/11 2027

## 2011-08-06 ENCOUNTER — Inpatient Hospital Stay (HOSPITAL_BASED_OUTPATIENT_CLINIC_OR_DEPARTMENT_OTHER)
Admission: EM | Admit: 2011-08-06 | Discharge: 2011-08-12 | DRG: 689 | Disposition: A | Discharge status: Skilled Nursing Facility | Attending: Internal Medicine | Admitting: Internal Medicine

## 2011-08-06 ENCOUNTER — Encounter (HOSPITAL_BASED_OUTPATIENT_CLINIC_OR_DEPARTMENT_OTHER): Payer: Self-pay | Admitting: *Deleted

## 2011-08-06 ENCOUNTER — Emergency Department (HOSPITAL_BASED_OUTPATIENT_CLINIC_OR_DEPARTMENT_OTHER)

## 2011-08-06 DIAGNOSIS — A498 Other bacterial infections of unspecified site: Secondary | ICD-10-CM | POA: Diagnosis present

## 2011-08-06 DIAGNOSIS — Z66 Do not resuscitate: Secondary | ICD-10-CM | POA: Diagnosis present

## 2011-08-06 DIAGNOSIS — Z794 Long term (current) use of insulin: Secondary | ICD-10-CM

## 2011-08-06 DIAGNOSIS — E785 Hyperlipidemia, unspecified: Secondary | ICD-10-CM | POA: Diagnosis present

## 2011-08-06 DIAGNOSIS — N185 Chronic kidney disease, stage 5: Secondary | ICD-10-CM | POA: Diagnosis present

## 2011-08-06 DIAGNOSIS — Z7982 Long term (current) use of aspirin: Secondary | ICD-10-CM

## 2011-08-06 DIAGNOSIS — Z880 Allergy status to penicillin: Secondary | ICD-10-CM

## 2011-08-06 DIAGNOSIS — E1142 Type 2 diabetes mellitus with diabetic polyneuropathy: Secondary | ICD-10-CM | POA: Diagnosis present

## 2011-08-06 DIAGNOSIS — M810 Age-related osteoporosis without current pathological fracture: Secondary | ICD-10-CM | POA: Diagnosis present

## 2011-08-06 DIAGNOSIS — N189 Chronic kidney disease, unspecified: Secondary | ICD-10-CM | POA: Diagnosis present

## 2011-08-06 DIAGNOSIS — Z79899 Other long term (current) drug therapy: Secondary | ICD-10-CM

## 2011-08-06 DIAGNOSIS — E1129 Type 2 diabetes mellitus with other diabetic kidney complication: Secondary | ICD-10-CM | POA: Diagnosis present

## 2011-08-06 DIAGNOSIS — E1139 Type 2 diabetes mellitus with other diabetic ophthalmic complication: Secondary | ICD-10-CM | POA: Diagnosis present

## 2011-08-06 DIAGNOSIS — K900 Celiac disease: Secondary | ICD-10-CM | POA: Diagnosis present

## 2011-08-06 DIAGNOSIS — M109 Gout, unspecified: Secondary | ICD-10-CM | POA: Diagnosis present

## 2011-08-06 DIAGNOSIS — E860 Dehydration: Secondary | ICD-10-CM | POA: Diagnosis present

## 2011-08-06 DIAGNOSIS — E1149 Type 2 diabetes mellitus with other diabetic neurological complication: Secondary | ICD-10-CM | POA: Diagnosis present

## 2011-08-06 DIAGNOSIS — I10 Essential (primary) hypertension: Secondary | ICD-10-CM | POA: Diagnosis present

## 2011-08-06 DIAGNOSIS — I120 Hypertensive chronic kidney disease with stage 5 chronic kidney disease or end stage renal disease: Secondary | ICD-10-CM | POA: Diagnosis present

## 2011-08-06 DIAGNOSIS — R5381 Other malaise: Secondary | ICD-10-CM | POA: Diagnosis present

## 2011-08-06 DIAGNOSIS — F068 Other specified mental disorders due to known physiological condition: Secondary | ICD-10-CM | POA: Diagnosis present

## 2011-08-06 DIAGNOSIS — N390 Urinary tract infection, site not specified: Principal | ICD-10-CM | POA: Diagnosis present

## 2011-08-06 DIAGNOSIS — E11319 Type 2 diabetes mellitus with unspecified diabetic retinopathy without macular edema: Secondary | ICD-10-CM | POA: Diagnosis present

## 2011-08-06 DIAGNOSIS — G9341 Metabolic encephalopathy: Secondary | ICD-10-CM | POA: Diagnosis present

## 2011-08-06 DIAGNOSIS — I2510 Atherosclerotic heart disease of native coronary artery without angina pectoris: Secondary | ICD-10-CM | POA: Diagnosis present

## 2011-08-06 DIAGNOSIS — E876 Hypokalemia: Secondary | ICD-10-CM | POA: Diagnosis present

## 2011-08-06 DIAGNOSIS — N179 Acute kidney failure, unspecified: Secondary | ICD-10-CM | POA: Diagnosis present

## 2011-08-06 HISTORY — DX: Celiac disease: K90.0

## 2011-08-06 HISTORY — DX: Gout, unspecified: M10.9

## 2011-08-06 LAB — URINALYSIS, ROUTINE W REFLEX MICROSCOPIC
Glucose, UA: NEGATIVE mg/dL
Ketones, ur: NEGATIVE mg/dL
Protein, ur: 30 mg/dL — AB

## 2011-08-06 LAB — BASIC METABOLIC PANEL
BUN: 122 mg/dL — ABNORMAL HIGH (ref 6–23)
Chloride: 90 mEq/L — ABNORMAL LOW (ref 96–112)
Creatinine, Ser: 4.8 mg/dL — ABNORMAL HIGH (ref 0.50–1.10)
GFR calc Af Amer: 9 mL/min — ABNORMAL LOW (ref >90)
Glucose, Bld: 103 mg/dL — ABNORMAL HIGH (ref 70–99)

## 2011-08-06 LAB — DIFFERENTIAL
Basophils Relative: 1 % (ref 0–1)
Eosinophils Absolute: 0 10*3/uL (ref 0.0–0.7)
Monocytes Absolute: 0.5 10*3/uL (ref 0.1–1.0)
Monocytes Relative: 6 % (ref 3–12)
Neutro Abs: 6.1 10*3/uL (ref 1.7–7.7)

## 2011-08-06 LAB — CBC
HCT: 34 % — ABNORMAL LOW (ref 36.0–46.0)
Hemoglobin: 11.8 g/dL — ABNORMAL LOW (ref 12.0–15.0)
MCH: 32.6 pg (ref 26.0–34.0)
MCHC: 34.7 g/dL (ref 30.0–36.0)

## 2011-08-06 LAB — URINE MICROSCOPIC-ADD ON

## 2011-08-06 LAB — MAGNESIUM: Magnesium: 1.8 mg/dL (ref 1.5–2.5)

## 2011-08-06 MED ORDER — POTASSIUM CHLORIDE 10 MEQ/100ML IV SOLN
10.0000 meq | Freq: Once | INTRAVENOUS | Status: AC
  Administered 2011-08-06: 10 meq via INTRAVENOUS
  Filled 2011-08-06: qty 100

## 2011-08-06 MED ORDER — SODIUM CHLORIDE 0.9 % IV SOLN
INTRAVENOUS | Status: DC
  Administered 2011-08-07: 03:00:00 via INTRAVENOUS

## 2011-08-06 MED ORDER — DEXTROSE 5 % IV SOLN
1.0000 g | Freq: Once | INTRAVENOUS | Status: AC
  Administered 2011-08-06: 1 g via INTRAVENOUS
  Filled 2011-08-06: qty 10

## 2011-08-06 NOTE — ED Notes (Signed)
Dr. Radford Pax is aware of Pt. K+

## 2011-08-06 NOTE — ED Notes (Signed)
Family at bedside. 

## 2011-08-06 NOTE — ED Notes (Signed)
Patient was cleaned and dried; upon arrival patient's brief and pants were wet.

## 2011-08-06 NOTE — ED Notes (Signed)
EMS reports patient is coming from [facility] with fever, lethargic and possible uti and dehydration.  States patients loc is normal per staff at nursing home.  States for the last 2 days patient has had a decreased po intake.

## 2011-08-06 NOTE — ED Notes (Signed)
Pt. Family has gone home and is aware of Pt. Going to [facility]

## 2011-08-06 NOTE — ED Provider Notes (Addendum)
History     CSN: [id number]  Arrival date & time 08/06/11  1737   First MD Initiated Contact with Patient 08/06/11 1755      Chief Complaint  Patient presents with  . Fever     Patient is a 78 y.o. female presenting with fever.  Fever Primary symptoms of the febrile illness include fever.  EMS reports patient is coming from [facility] with fever, lethargic and possible uti and dehydration. States patients loc is normal per staff at nursing home. States for the last 2 days patient has had a decreased po intake.   Past Medical History  Diagnosis Date  . Dementia   . Hypertension   . Thyroid disease   . CAD (coronary artery disease)   . Neuropathy   . Hyperlipidemia   . Arthritis   . Osteoporosis   . Retinopathy   . Diabetes mellitus   . Gout   . Hyperlipidemia   . Celiac disease     History reviewed. No pertinent past surgical history.  No family history on file.  History  Substance Use Topics  . Smoking status: Never Smoker   . Smokeless tobacco: Never Used  . Alcohol Use: No    OB History    Grav Para Term Preterm Abortions TAB SAB Ect Mult Living                  Review of Systems  Constitutional: Positive for fever.    Allergies  Codeine; Hydrocodone; Penicillins; and Sulfa antibiotics  Home Medications   No current outpatient prescriptions on file.  BP 192/66  Pulse 74  Temp 98.2 F (36.8 C) (Oral)  Resp 18  Ht 5\' 8"  (1.727 m)  Wt 202 lb 6.1 oz (91.8 kg)  BMI 30.77 kg/m2  SpO2 94%  Physical Exam  Nursing note and vitals reviewed. Constitutional: She appears well-developed and well-nourished. No distress.  HENT:  Head: Normocephalic and atraumatic.  Mouth/Throat: Mucous membranes are dry.  Eyes: Pupils are equal, round, and reactive to light.  Neck: Normal range of motion.  Cardiovascular: Normal rate and intact distal pulses.        Sinus bradycardia Rate=52 Right bundle branch block Left anterior fascicular block  Bifascicular block  Left ventricular hypertrophy with repolarization abnormality Abnormal ECG No significant change since last tracing  Pulmonary/Chest: No respiratory distress.  Abdominal: Normal appearance. She exhibits no distension.  Musculoskeletal: Normal range of motion.  Neurological: She is alert. No cranial nerve deficit.  Skin: Skin is warm and dry. No rash noted.  Psychiatric: She has a normal mood and affect. Her behavior is normal.    ED Course  Procedures (including critical care time)  Labs Reviewed  URINALYSIS, ROUTINE W REFLEX MICROSCOPIC - Abnormal; Notable for the following:    APPearance TURBID (*)     Hgb urine dipstick TRACE (*)     Protein, ur 30 (*)     Nitrite POSITIVE (*)     Leukocytes, UA LARGE (*)     All other components within normal limits  CBC - Abnormal; Notable for the following:    RBC 3.62 (*)     Hemoglobin 11.8 (*)     HCT 34.0 (*)     All other components within normal limits  BASIC METABOLIC PANEL - Abnormal; Notable for the following:    Potassium 2.5 (*)     Chloride 90 (*)     CO2 33 (*)     Glucose, Bld 103 (*)  BUN 122 (*)  RESULT CONFIRMED BY AUTOMATED DILUTION   Creatinine, Ser 4.80 (*)     GFR calc non Af Amer 8 (*)     GFR calc Af Amer 9 (*)     All other components within normal limits  URINE MICROSCOPIC-ADD ON - Abnormal; Notable for the following:    Bacteria, UA MANY (*)     All other components within normal limits  CBC - Abnormal; Notable for the following:    WBC 11.2 (*)     RBC 3.82 (*)     All other components within normal limits  COMPREHENSIVE METABOLIC PANEL - Abnormal; Notable for the following:    Potassium 2.8 (*)     Chloride 91 (*)     Glucose, Bld 160 (*)     BUN 116 (*)     Creatinine, Ser 4.09 (*)     Albumin 2.8 (*)     Alkaline Phosphatase 143 (*)     GFR calc non Af Amer 10 (*)     GFR calc Af Amer 11 (*)     All other components within normal limits  GLUCOSE, CAPILLARY - Abnormal;  Notable for the following:    Glucose-Capillary 177 (*)     All other components within normal limits  COMPREHENSIVE METABOLIC PANEL - Abnormal; Notable for the following:    Potassium 2.5 (*)     Chloride 95 (*)     CO2 33 (*)     Glucose, Bld 128 (*)     BUN 110 (*)     Creatinine, Ser 3.96 (*)     Albumin 2.7 (*)     Alkaline Phosphatase 125 (*)     GFR calc non Af Amer 10 (*)     GFR calc Af Amer 11 (*)     All other components within normal limits  CBC - Abnormal; Notable for the following:    RBC 3.32 (*)     Hemoglobin 10.6 (*)     HCT 31.9 (*)     All other components within normal limits  GLUCOSE, CAPILLARY - Abnormal; Notable for the following:    Glucose-Capillary 188 (*)     All other components within normal limits  GLUCOSE, CAPILLARY - Abnormal; Notable for the following:    Glucose-Capillary 180 (*)     All other components within normal limits  GLUCOSE, CAPILLARY - Abnormal; Notable for the following:    Glucose-Capillary 122 (*)     All other components within normal limits  GLUCOSE, CAPILLARY - Abnormal; Notable for the following:    Glucose-Capillary 115 (*)     All other components within normal limits  COMPREHENSIVE METABOLIC PANEL - Abnormal; Notable for the following:    Potassium 3.1 (*)     CO2 33 (*)     Glucose, Bld 117 (*)     BUN 100 (*)     Creatinine, Ser 3.60 (*)     Albumin 2.7 (*)     Alkaline Phosphatase 128 (*)     GFR calc non Af Amer 11 (*)     GFR calc Af Amer 13 (*)     All other components within normal limits  CBC - Abnormal; Notable for the following:    RBC 3.51 (*)     Hemoglobin 11.1 (*)     HCT 34.1 (*)     All other components within normal limits  GLUCOSE, CAPILLARY - Abnormal; Notable for the following:  Glucose-Capillary 118 (*)     All other components within normal limits  GLUCOSE, CAPILLARY - Abnormal; Notable for the following:    Glucose-Capillary 203 (*)     All other components within normal limits    GLUCOSE, CAPILLARY - Abnormal; Notable for the following:    Glucose-Capillary 109 (*)     All other components within normal limits  GLUCOSE, CAPILLARY - Abnormal; Notable for the following:    Glucose-Capillary 133 (*)     All other components within normal limits  COMPREHENSIVE METABOLIC PANEL - Abnormal; Notable for the following:    Glucose, Bld 149 (*)     BUN 87 (*)     Creatinine, Ser 3.03 (*)     Albumin 2.6 (*)     Alkaline Phosphatase 130 (*)     GFR calc non Af Amer 14 (*)     GFR calc Af Amer 16 (*)     All other components within normal limits  CBC - Abnormal; Notable for the following:    RBC 3.46 (*)     Hemoglobin 10.8 (*)     HCT 33.9 (*)     All other components within normal limits  GLUCOSE, CAPILLARY - Abnormal; Notable for the following:    Glucose-Capillary 150 (*)     All other components within normal limits  GLUCOSE, CAPILLARY - Abnormal; Notable for the following:    Glucose-Capillary 220 (*)     All other components within normal limits  GLUCOSE, CAPILLARY - Abnormal; Notable for the following:    Glucose-Capillary 143 (*)     All other components within normal limits  URINE CULTURE  LACTIC ACID, PLASMA  DIFFERENTIAL  CULTURE, BLOOD (ROUTINE X 2)  CULTURE, BLOOD (ROUTINE X 2)  MAGNESIUM  MRSA PCR SCREENING  MAGNESIUM   No results found.   1. UTI (lower urinary tract infection)   2. Hypokalemia       MDM          Nelia Shi, MD 08/10/11 1610  Nelia Shi, MD 09/05/11 [id number]

## 2011-08-07 DIAGNOSIS — E876 Hypokalemia: Secondary | ICD-10-CM | POA: Diagnosis present

## 2011-08-07 DIAGNOSIS — N390 Urinary tract infection, site not specified: Secondary | ICD-10-CM | POA: Diagnosis present

## 2011-08-07 LAB — COMPREHENSIVE METABOLIC PANEL
AST: 23 U/L (ref 0–37)
Albumin: 2.8 g/dL — ABNORMAL LOW (ref 3.5–5.2)
BUN: 116 mg/dL — ABNORMAL HIGH (ref 6–23)
Calcium: 9.5 mg/dL (ref 8.4–10.5)
Chloride: 91 mEq/L — ABNORMAL LOW (ref 96–112)
Creatinine, Ser: 4.09 mg/dL — ABNORMAL HIGH (ref 0.50–1.10)
Total Bilirubin: 0.6 mg/dL (ref 0.3–1.2)
Total Protein: 6.6 g/dL (ref 6.0–8.3)

## 2011-08-07 LAB — CBC
HCT: 36.8 % (ref 36.0–46.0)
MCH: 31.9 pg (ref 26.0–34.0)
MCHC: 33.2 g/dL (ref 30.0–36.0)
MCV: 96.3 fL (ref 78.0–100.0)
Platelets: 268 10*3/uL (ref 150–400)
RDW: 14.9 % (ref 11.5–15.5)
WBC: 11.2 10*3/uL — ABNORMAL HIGH (ref 4.0–10.5)

## 2011-08-07 LAB — GLUCOSE, CAPILLARY
Glucose-Capillary: 177 mg/dL — ABNORMAL HIGH (ref 70–99)
Glucose-Capillary: 180 mg/dL — ABNORMAL HIGH (ref 70–99)

## 2011-08-07 MED ORDER — FOLIC ACID-VIT B6-VIT B12 2.2-25-1 MG PO TABS: 1.0000 | ORAL_TABLET | Freq: Every day | ORAL | Status: DC

## 2011-08-07 MED ORDER — POLYETHYLENE GLYCOL 3350 17 GM/SCOOP PO POWD
17.0000 g | Freq: Every day | ORAL | Status: DC | PRN
  Filled 2011-08-07: qty 255

## 2011-08-07 MED ORDER — SODIUM CHLORIDE 0.9 % IV SOLN: INTRAVENOUS | Status: DC

## 2011-08-07 MED ORDER — PANTOPRAZOLE SODIUM 40 MG PO TBEC
40.0000 mg | DELAYED_RELEASE_TABLET | Freq: Every day | ORAL | Status: DC
  Administered 2011-08-07 – 2011-08-12 (×6): 40 mg via ORAL
  Filled 2011-08-07 (×6): qty 1

## 2011-08-07 MED ORDER — LEVOTHYROXINE SODIUM 100 MCG PO TABS
100.0000 ug | ORAL_TABLET | Freq: Every day | ORAL | Status: DC
  Administered 2011-08-07 – 2011-08-12 (×6): 100 ug via ORAL
  Filled 2011-08-07 (×7): qty 1

## 2011-08-07 MED ORDER — POTASSIUM CHLORIDE 10 MEQ/100ML IV SOLN
10.0000 meq | Freq: Once | INTRAVENOUS | Status: AC
  Administered 2011-08-07: 10 meq via INTRAVENOUS
  Filled 2011-08-07: qty 100

## 2011-08-07 MED ORDER — NITROGLYCERIN 0.4 MG SL SUBL: 0.4000 mg | SUBLINGUAL_TABLET | SUBLINGUAL | Status: DC | PRN

## 2011-08-07 MED ORDER — ONDANSETRON HCL 4 MG/2ML IJ SOLN
4.0000 mg | Freq: Three times a day (TID) | INTRAMUSCULAR | Status: AC | PRN
  Administered 2011-08-07: 4 mg via INTRAVENOUS
  Filled 2011-08-07: qty 2

## 2011-08-07 MED ORDER — NYSTATIN 100000 UNIT/GM EX CREA
1.0000 "application " | TOPICAL_CREAM | Freq: Two times a day (BID) | CUTANEOUS | Status: DC
  Administered 2011-08-07 – 2011-08-12 (×10): 1 via TOPICAL
  Filled 2011-08-07 (×2): qty 15

## 2011-08-07 MED ORDER — POTASSIUM CHLORIDE CRYS ER 20 MEQ PO TBCR
20.0000 meq | EXTENDED_RELEASE_TABLET | Freq: Once | ORAL | Status: AC
  Administered 2011-08-07: 20 meq via ORAL
  Filled 2011-08-07: qty 1

## 2011-08-07 MED ORDER — CIPROFLOXACIN IN D5W 200 MG/100ML IV SOLN
200.0000 mg | INTRAVENOUS | Status: DC
  Administered 2011-08-07 – 2011-08-09 (×3): 200 mg via INTRAVENOUS
  Filled 2011-08-07 (×5): qty 100

## 2011-08-07 MED ORDER — CARVEDILOL 6.25 MG PO TABS
6.2500 mg | ORAL_TABLET | Freq: Two times a day (BID) | ORAL | Status: DC
  Administered 2011-08-07 – 2011-08-12 (×11): 6.25 mg via ORAL
  Filled 2011-08-07 (×13): qty 1

## 2011-08-07 MED ORDER — ACETAMINOPHEN 325 MG PO TABS
650.0000 mg | ORAL_TABLET | ORAL | Status: DC | PRN
  Administered 2011-08-10: 650 mg via ORAL
  Filled 2011-08-07: qty 2

## 2011-08-07 MED ORDER — BIOTENE DRY MOUTH MT LIQD
15.0000 mL | Freq: Two times a day (BID) | OROMUCOSAL | Status: DC
  Administered 2011-08-08 – 2011-08-12 (×9): 15 mL via OROMUCOSAL

## 2011-08-07 MED ORDER — GABAPENTIN 100 MG PO CAPS
100.0000 mg | ORAL_CAPSULE | Freq: Three times a day (TID) | ORAL | Status: DC
  Administered 2011-08-07 – 2011-08-12 (×17): 100 mg via ORAL
  Filled 2011-08-07 (×19): qty 1

## 2011-08-07 MED ORDER — FUROSEMIDE 80 MG PO TABS
160.0000 mg | ORAL_TABLET | Freq: Every day | ORAL | Status: DC
  Administered 2011-08-07: 160 mg via ORAL
  Filled 2011-08-07 (×2): qty 2

## 2011-08-07 MED ORDER — SENNA 8.6 MG PO TABS
1.0000 | ORAL_TABLET | Freq: Every day | ORAL | Status: DC
  Administered 2011-08-07 – 2011-08-12 (×6): 8.6 mg via ORAL
  Filled 2011-08-07 (×6): qty 1

## 2011-08-07 MED ORDER — AMLODIPINE BESYLATE 5 MG PO TABS
5.0000 mg | ORAL_TABLET | Freq: Every day | ORAL | Status: DC
  Administered 2011-08-07 – 2011-08-12 (×6): 5 mg via ORAL
  Filled 2011-08-07 (×6): qty 1

## 2011-08-07 MED ORDER — ESCITALOPRAM OXALATE 20 MG PO TABS
20.0000 mg | ORAL_TABLET | Freq: Every day | ORAL | Status: DC
  Administered 2011-08-07 – 2011-08-11 (×5): 20 mg via ORAL
  Filled 2011-08-07 (×6): qty 1

## 2011-08-07 MED ORDER — SODIUM CHLORIDE 0.9 % IV SOLN
INTRAVENOUS | Status: DC
  Administered 2011-08-07: 10:00:00 via INTRAVENOUS
  Administered 2011-08-08: 500 mL via INTRAVENOUS
  Administered 2011-08-09 – 2011-08-10 (×3): via INTRAVENOUS
  Administered 2011-08-11: 1000 mL via INTRAVENOUS

## 2011-08-07 MED ORDER — PROCHLORPERAZINE MALEATE 5 MG PO TABS
5.0000 mg | ORAL_TABLET | Freq: Four times a day (QID) | ORAL | Status: DC | PRN
  Administered 2011-08-07 – 2011-08-11 (×2): 5 mg via ORAL
  Filled 2011-08-07 (×2): qty 1

## 2011-08-07 MED ORDER — CALCITRIOL 0.25 MCG PO CAPS
0.2500 ug | ORAL_CAPSULE | Freq: Every day | ORAL | Status: DC
  Administered 2011-08-07 – 2011-08-12 (×6): 0.25 ug via ORAL
  Filled 2011-08-07 (×6): qty 1

## 2011-08-07 MED ORDER — CLONIDINE HCL 0.1 MG PO TABS
0.1000 mg | ORAL_TABLET | Freq: Two times a day (BID) | ORAL | Status: DC
  Administered 2011-08-07 – 2011-08-12 (×11): 0.1 mg via ORAL
  Filled 2011-08-07 (×12): qty 1

## 2011-08-07 MED ORDER — OXYCODONE HCL 5 MG PO TABS: 5.0000 mg | ORAL_TABLET | Freq: Four times a day (QID) | ORAL | Status: DC | PRN

## 2011-08-07 MED ORDER — DOCUSATE SODIUM 100 MG PO CAPS
100.0000 mg | ORAL_CAPSULE | Freq: Two times a day (BID) | ORAL | Status: DC
  Administered 2011-08-07 – 2011-08-12 (×11): 100 mg via ORAL
  Filled 2011-08-07 (×12): qty 1

## 2011-08-07 MED ORDER — HYDRALAZINE HCL 25 MG PO TABS
25.0000 mg | ORAL_TABLET | Freq: Three times a day (TID) | ORAL | Status: DC
  Administered 2011-08-07 – 2011-08-10 (×10): 25 mg via ORAL
  Filled 2011-08-07 (×13): qty 1

## 2011-08-07 MED ORDER — ASPIRIN 81 MG PO CHEW
81.0000 mg | CHEWABLE_TABLET | Freq: Every day | ORAL | Status: DC
  Administered 2011-08-07 – 2011-08-12 (×6): 81 mg via ORAL
  Filled 2011-08-07 (×6): qty 1

## 2011-08-07 MED ORDER — ENOXAPARIN SODIUM 30 MG/0.3ML ~~LOC~~ SOLN
30.0000 mg | SUBCUTANEOUS | Status: DC
  Administered 2011-08-07 – 2011-08-12 (×6): 30 mg via SUBCUTANEOUS
  Filled 2011-08-07 (×6): qty 0.3

## 2011-08-07 MED ORDER — CAMPHOR-MENTHOL 0.2-3.5 % EX GEL: 1.0000 "application " | Freq: Three times a day (TID) | CUTANEOUS | Status: DC | PRN

## 2011-08-07 MED ORDER — COLCHICINE 0.6 MG PO TABS
0.6000 mg | ORAL_TABLET | Freq: Every day | ORAL | Status: DC | PRN
  Filled 2011-08-07: qty 1

## 2011-08-07 MED ORDER — INSULIN GLARGINE 100 UNIT/ML ~~LOC~~ SOLN
20.0000 [IU] | Freq: Every day | SUBCUTANEOUS | Status: DC
  Administered 2011-08-07 – 2011-08-11 (×5): 20 [IU] via SUBCUTANEOUS

## 2011-08-07 MED ORDER — ISOSORBIDE DINITRATE 20 MG PO TABS
20.0000 mg | ORAL_TABLET | Freq: Two times a day (BID) | ORAL | Status: DC
  Administered 2011-08-07 – 2011-08-12 (×11): 20 mg via ORAL
  Filled 2011-08-07 (×13): qty 1

## 2011-08-07 MED ORDER — FA-PYRIDOXINE-CYANOCOBALAMIN 2.5-25-2 MG PO TABS
1.0000 | ORAL_TABLET | Freq: Every day | ORAL | Status: DC
  Administered 2011-08-07 – 2011-08-12 (×6): 1 via ORAL
  Filled 2011-08-07 (×7): qty 1

## 2011-08-07 MED ORDER — DM-GUAIFENESIN ER 30-600 MG PO TB12
1.0000 | ORAL_TABLET | Freq: Two times a day (BID) | ORAL | Status: DC
  Administered 2011-08-07 – 2011-08-12 (×11): 1 via ORAL
  Filled 2011-08-07 (×12): qty 1

## 2011-08-07 MED ORDER — MUSCLE RUB 10-15 % EX CREA
TOPICAL_CREAM | Freq: Three times a day (TID) | CUTANEOUS | Status: DC | PRN
  Administered 2011-08-09: 22:00:00 via TOPICAL
  Filled 2011-08-07: qty 85

## 2011-08-07 NOTE — H&P (Signed)
Carrie [name] is an 78 y.o. female.   Chief Complaint: altered mental status. HPI: Carrie [name] is a pleasant 78 year old female with multiple severe medical problems.  She is essentially at ESRD level and she has been followed by hospice for the last 3 months.  She is not going to get hemodialysis.  She reaffirms this today.  She had worsened mental status and presented to the ER.  There she is found to have ARF on CKD with worse bun and creatinine above baseline.  She is also found to have  Uti.   She cannot give a lot of history.  She says she has not been able to walk for some time.  She reports some right leg pain.   She denies cp or sob.  She will be admitted.  Past Medical History  Diagnosis Date  . Dementia   . Hypertension   . Thyroid disease   . CAD (coronary artery disease)   . Neuropathy   . Hyperlipidemia   . Arthritis   . Osteoporosis   . Retinopathy   . Diabetes mellitus   . Gout   . Hyperlipidemia   . Celiac disease Stage 5 CKD     History reviewed. No pertinent past surgical history. Found at this time.  No family history on file. Social History:  has an unknown smoking status. She has never used smokeless tobacco. She reports that she does not drink alcohol or use illicit drugs.  Allergies:  Allergies  Allergen Reactions  . Codeine   . Hydrocodone   . Penicillins   . Sulfa Antibiotics     Medications Prior to Admission  Medication Sig Dispense Refill  . amLODipine (NORVASC) 5 MG tablet Take 5 mg by mouth daily.        Marland Kitchen aspirin 81 MG chewable tablet Chew 81 mg by mouth daily.      . calcitRIOL (ROCALTROL) 0.25 MCG capsule Take 0.25 mcg by mouth daily.        . carvedilol (COREG) 6.25 MG tablet Take 6.25 mg by mouth 2 (two) times daily with a meal. At 8 am and 5 pm      . cloNIDine (CATAPRES) 0.1 MG tablet Take 0.1 mg by mouth 2 (two) times daily. At 9 am and 8 pm      . docusate sodium (COLACE) 100 MG capsule Take 100 mg by mouth 2 (two) times daily.          Marland Kitchen escitalopram (LEXAPRO) 20 MG tablet Take 20 mg by mouth at bedtime.      . Folic Acid-Vit B6-Vit B12 (FOLGARD RX) 2.2-25-1 MG TABS Take 1 tablet by mouth daily.      . furosemide (LASIX) 80 MG tablet Take 160 mg by mouth 2 (two) times daily. At 9 am and 9 pm      . gabapentin (NEURONTIN) 100 MG capsule Take 100 mg by mouth 3 (three) times daily.        . hydrALAZINE (APRESOLINE) 25 MG tablet Take 25 mg by mouth every 8 (eight) hours.        . insulin glargine (LANTUS) 100 UNIT/ML injection Inject 30 Units into the skin at bedtime.      . isosorbide dinitrate (ISORDIL) 20 MG tablet Take 20 mg by mouth 2 (two) times daily.        Marland Kitchen levothyroxine (SYNTHROID, LEVOTHROID) 100 MCG tablet Take 100 mcg by mouth daily.      . Multiple Vitamins-Iron (MULTIVITAMINS WITH  IRON) TABS Take 1 tablet by mouth daily.      Marland Kitchen nystatin cream (MYCOSTATIN) Apply 1 application topically 2 (two) times daily. Patient has this medication applied to her stomach.      Marland Kitchen omeprazole (PRILOSEC) 20 MG capsule Take 20 mg by mouth daily.        Marland Kitchen oxyCODONE (OXY IR/ROXICODONE) 5 MG immediate release tablet Take 5 mg by mouth every 6 (six) hours as needed. For moderate to severe pain      . prochlorperazine (COMPAZINE) 5 MG tablet Take 5 mg by mouth every 6 (six) hours as needed.      . senna (SENOKOT) 8.6 MG TABS Take 1 tablet by mouth daily. Patient is given this medication for constipation.      Marland Kitchen acetaminophen (TYLENOL) 325 MG tablet Take 650 mg by mouth every 4 (four) hours as needed. For pain      . Camphor-Menthol (FREEZE IT) 0.2-3.5 % GEL Apply 1 application topically 3 (three) times daily as needed. For knee pain      . colchicine 0.6 MG tablet Take 0.6 mg by mouth every 8 (eight) hours as needed. For gout      . dextromethorphan-guaiFENesin (MUCINEX DM) 30-600 MG per 12 hr tablet Take 1 tablet by mouth every 12 (twelve) hours. Patient is given this prn.      . nitroGLYCERIN (NITROSTAT) 0.4 MG SL tablet Place 0.4 mg under  the tongue every 5 (five) minutes as needed. For 3 doses for chest pain.  If no relief, call doctor      . polyethylene glycol powder (GLYCOLAX/MIRALAX) powder Take 17 g by mouth daily as needed. For constipation      . traMADol (ULTRAM) 50 MG tablet Take 50 mg by mouth every 6 (six) hours as needed. For moderate pain        Results for orders placed during the hospital encounter of 08/06/11 (from the past 48 hour(s))  LACTIC ACID, PLASMA     Status: Normal   Collection Time   08/06/11  6:07 PM      Component Value Range Comment   Lactic Acid, Venous 0.9  0.5 - 2.2 mmol/L   CBC     Status: Abnormal   Collection Time   08/06/11  6:07 PM      Component Value Range Comment   WBC 7.9  4.0 - 10.5 K/uL    RBC 3.62 (*) 3.87 - 5.11 MIL/uL    Hemoglobin 11.8 (*) 12.0 - 15.0 g/dL    HCT 16.1 (*) 09.6 - 46.0 %    MCV 93.9  78.0 - 100.0 fL    MCH 32.6  26.0 - 34.0 pg    MCHC 34.7  30.0 - 36.0 g/dL    RDW 04.5  40.9 - 81.1 %    Platelets 257  150 - 400 K/uL   DIFFERENTIAL     Status: Normal   Collection Time   08/06/11  6:07 PM      Component Value Range Comment   Neutrophils Relative 77  43 - 77 %    Neutro Abs 6.1  1.7 - 7.7 K/uL    Lymphocytes Relative 17  12 - 46 %    Lymphs Abs 1.3  0.7 - 4.0 K/uL    Monocytes Relative 6  3 - 12 %    Monocytes Absolute 0.5  0.1 - 1.0 K/uL    Eosinophils Relative 0  0 - 5 %    Eosinophils Absolute  0.0  0.0 - 0.7 K/uL    Basophils Relative 1  0 - 1 %    Basophils Absolute 0.1  0.0 - 0.1 K/uL   BASIC METABOLIC PANEL     Status: Abnormal   Collection Time   08/06/11  6:07 PM      Component Value Range Comment   Sodium 139  135 - 145 mEq/L    Potassium 2.5 (*) 3.5 - 5.1 mEq/L    Chloride 90 (*) 96 - 112 mEq/L    CO2 33 (*) 19 - 32 mEq/L    Glucose, Bld 103 (*) 70 - 99 mg/dL    BUN 161 (*) 6 - 23 mg/dL RESULT CONFIRMED BY AUTOMATED DILUTION   Creatinine, Ser 4.80 (*) 0.50 - 1.10 mg/dL    Calcium 9.8  8.4 - 09.6 mg/dL    GFR calc non Af Amer 8 (*) >90  mL/min    GFR calc Af Amer 9 (*) >90 mL/min   URINALYSIS, ROUTINE W REFLEX MICROSCOPIC     Status: Abnormal   Collection Time   08/06/11  6:40 PM      Component Value Range Comment   Color, Urine YELLOW  YELLOW    APPearance TURBID (*) CLEAR    Specific Gravity, Urine 1.011  1.005 - 1.030    pH 5.0  5.0 - 8.0    Glucose, UA NEGATIVE  NEGATIVE mg/dL    Hgb urine dipstick TRACE (*) NEGATIVE    Bilirubin Urine NEGATIVE  NEGATIVE    Ketones, ur NEGATIVE  NEGATIVE mg/dL    Protein, ur 30 (*) NEGATIVE mg/dL    Urobilinogen, UA 0.2  0.0 - 1.0 mg/dL    Nitrite POSITIVE (*) NEGATIVE    Leukocytes, UA LARGE (*) NEGATIVE   URINE MICROSCOPIC-ADD ON     Status: Abnormal   Collection Time   08/06/11  6:40 PM      Component Value Range Comment   Squamous Epithelial / LPF RARE  RARE    WBC, UA TOO NUMEROUS TO COUNT  <3 WBC/hpf    RBC / HPF 0-2  <3 RBC/hpf    Bacteria, UA MANY (*) RARE   MAGNESIUM     Status: Normal   Collection Time   08/06/11  9:20 PM      Component Value Range Comment   Magnesium 1.8  1.5 - 2.5 mg/dL    Dg Chest Port 1 View  08/06/2011  *RADIOLOGY REPORT*  Clinical Data: Fever.  PORTABLE CHEST - 1 VIEW  Comparison: 12/06/2010  Findings: There is mild chronic cardiomegaly.  Pulmonary vascularity is slightly prominent but there are no infiltrates or effusions and there is no pulmonary edema.  No acute osseous abnormality.  Severe degenerative changes of both shoulders, right greater than left.  IMPRESSION: Slight pulmonary vascular prominence and cardiomegaly.  Original Report Authenticated By: Gwynn Burly, M.D.    ROS:as per hpi  Blood pressure 196/67, pulse 64, temperature 98 F (36.7 C), temperature source Oral, resp. rate 18, weight 91.8 kg (202 lb 6.1 oz), SpO2 93.00%.  Age appropriate female. Lying supine in no acute distress. Speech is slow and she doesn't have answers to all questions.  Mouth is dry.  Mild pallor. No icterus.   Bronchial bs bilaterally but no w/r/r  noted.  Heart is rrr with no sig m/r/g. abd is soft, not tender, no mass or hsm.  She has 1+ bilateral lower extremity edema.  Moe times 4.  Assessment/Plan UTI-  Treat  with IV cipro dosed for renal function. DNR will be honored. Hospice consult if needed. CKD- give low dose IV.  Replace potassium carefully. HTN-follow DM2 - follow cbg and use low dose ssi.   Ezequiel Kayser, MD 08/07/2011, 7:40 AM

## 2011-08-07 NOTE — Progress Notes (Signed)
MRSA swab negative. Contact Isolation DC'd per protocol.

## 2011-08-07 NOTE — Progress Notes (Signed)
DNR.  Related admission.  Patient in the bed with eyes closed.  No apparent signs of pain noted.  No family present at bedside.  Chart reviewed.  Please contact [facility] at [phone] with any questions or concerns.

## 2011-08-07 NOTE — Progress Notes (Signed)
Pt. Arrived to floor per [facility] gurney.  Transferred patient to bed.  O2, IV continued.  Pt. Alert, states no pain at present.  Pt. A&O x1, oriented to room.  Pt. Had been incontinent of moderate amount urine.  Pt. Cleaned, dry pads placed.  Dr. Waynard Edwards called for further orders, advised of room number.  Lester Kinsman Lonia Mad, RN

## 2011-08-07 NOTE — Progress Notes (Signed)
Pt. Incontinent of large amount of urine, moderate amount of soft stool.  Pt. Cleaned, clean pads placed.  Per physician's order, 14Fr foley urethral catheter placed, balloon inflated with 10ml sterile water, catheter secured with  StatLock.  No immediate urine return noted, will recheck within the hour. Pt. Tolerated the procedure well, states still without pain. Lester Kinsman Lonia Mad, RN

## 2011-08-08 ENCOUNTER — Encounter (HOSPITAL_COMMUNITY): Payer: Self-pay | Admitting: *Deleted

## 2011-08-08 LAB — GLUCOSE, CAPILLARY
Glucose-Capillary: 122 mg/dL — ABNORMAL HIGH (ref 70–99)
Glucose-Capillary: 115 mg/dL — ABNORMAL HIGH (ref 70–99)
Glucose-Capillary: 203 mg/dL — ABNORMAL HIGH (ref 70–99)

## 2011-08-08 LAB — COMPREHENSIVE METABOLIC PANEL
Albumin: 2.7 g/dL — ABNORMAL LOW (ref 3.5–5.2)
BUN: 110 mg/dL — ABNORMAL HIGH (ref 6–23)
CO2: 33 mEq/L — ABNORMAL HIGH (ref 19–32)
Creatinine, Ser: 3.96 mg/dL — ABNORMAL HIGH (ref 0.50–1.10)
Potassium: 2.5 mEq/L — CL (ref 3.5–5.1)
Sodium: 143 mEq/L (ref 135–145)
Total Bilirubin: 0.5 mg/dL (ref 0.3–1.2)
Total Protein: 6.1 g/dL (ref 6.0–8.3)

## 2011-08-08 LAB — CBC
MCV: 96.1 fL (ref 78.0–100.0)
Platelets: 263 10*3/uL (ref 150–400)
RBC: 3.32 MIL/uL — ABNORMAL LOW (ref 3.87–5.11)
WBC: 9.3 10*3/uL (ref 4.0–10.5)

## 2011-08-08 MED ORDER — POTASSIUM CHLORIDE CRYS ER 20 MEQ PO TBCR
20.0000 meq | EXTENDED_RELEASE_TABLET | Freq: Two times a day (BID) | ORAL | Status: AC
  Administered 2011-08-08 (×2): 20 meq via ORAL
  Filled 2011-08-08 (×3): qty 1

## 2011-08-08 MED ORDER — POTASSIUM CHLORIDE 10 MEQ/100ML IV SOLN
10.0000 meq | INTRAVENOUS | Status: AC
  Administered 2011-08-08 (×3): 10 meq via INTRAVENOUS
  Filled 2011-08-08 (×3): qty 100

## 2011-08-08 NOTE — Progress Notes (Signed)
Subjective: She feels some better and is clearly more alert today. Able to swallow her pills.  Objective: Vital signs in last 24 hours: Temp:  [98.2 F (36.8 C)-99.3 F (37.4 C)] 98.2 F (36.8 C) (06/16 0616) Pulse Rate:  [56-68] 59  (06/16 0616) Resp:  [15-16] 16  (06/16 0616) BP: (147-164)/(64-79) 162/66 mmHg (06/16 1059) SpO2:  [92 %-96 %] 92 % (06/16 0616) Weight change:  Last BM Date: 08/07/11  Intake/Output from previous day: 06/15 0701 - 06/16 0700 In: 1350.5 [P.O.:650; I.V.:600.5; IV Piggyback:100] Out: 1800 [Urine:1800] Intake/Output this shift:    General appearance: alert, cooperative and appears stated age Resp: clear to auscultation bilaterally Cardio: regular rate and rhythm, S1, S2 normal, no murmur, click, rub or gallop GI: soft, non-tender; bowel sounds normal; no masses,  no organomegaly Extremities: extremities normal, atraumatic, no cyanosis or edema Neurologic: Grossly normal   Lab Results:  Basename 08/08/11 0437 08/07/11 0904  WBC 9.3 11.2*  HGB 10.6* 12.2  HCT 31.9* 36.8  PLT 263 268   BMET  Basename 08/08/11 0437 08/07/11 0904  NA 143 137  K 2.5* 2.8*  CL 95* 91*  CO2 33* 25  GLUCOSE 128* 160*  BUN 110* 116*  CREATININE 3.96* 4.09*  CALCIUM 9.5 9.5   CMET CMP     Component Value Date/Time   NA 143 08/08/2011 0437   K 2.5* 08/08/2011 0437   CL 95* 08/08/2011 0437   CO2 33* 08/08/2011 0437   GLUCOSE 128* 08/08/2011 0437   BUN 110* 08/08/2011 0437   CREATININE 3.96* 08/08/2011 0437   CALCIUM 9.5 08/08/2011 0437   PROT 6.1 08/08/2011 0437   ALBUMIN 2.7* 08/08/2011 0437   AST 16 08/08/2011 0437   ALT 12 08/08/2011 0437   ALKPHOS 125* 08/08/2011 0437   BILITOT 0.5 08/08/2011 0437   GFRNONAA 10* 08/08/2011 0437   GFRAA 11* 08/08/2011 0437     Studies/Results: Dg Chest Port 1 View  08/06/2011  *RADIOLOGY REPORT*  Clinical Data: Fever.  PORTABLE CHEST - 1 VIEW  Comparison: 12/06/2010  Findings: There is mild chronic cardiomegaly.  Pulmonary  vascularity is slightly prominent but there are no infiltrates or effusions and there is no pulmonary edema.  No acute osseous abnormality.  Severe degenerative changes of both shoulders, right greater than left.  IMPRESSION: Slight pulmonary vascular prominence and cardiomegaly.  Original Report Authenticated By: Gwynn Burly, M.D.    Medications: I have reviewed the patient's current medications.  CBG (last 3)   Basename 08/08/11 0736 08/07/11 2055 08/07/11 1718  GLUCAP 122* 180* 188*    Scheduled Meds:   . amLODipine  5 mg Oral Daily  . antiseptic oral rinse  15 mL Mouth Rinse BID  . aspirin  81 mg Oral Daily  . calcitRIOL  0.25 mcg Oral Daily  . carvedilol  6.25 mg Oral BID WC  . ciprofloxacin  200 mg Intravenous Q24H  . cloNIDine  0.1 mg Oral BID  . dextromethorphan-guaiFENesin  1 tablet Oral Q12H  . docusate sodium  100 mg Oral BID  . enoxaparin  30 mg Subcutaneous Q24H  . escitalopram  20 mg Oral QHS  . folic acid-pyridoxine-cyancobalamin  1 tablet Oral Daily  . furosemide  160 mg Oral Daily  . gabapentin  100 mg Oral TID  . hydrALAZINE  25 mg Oral Q8H  . insulin glargine  20 Units Subcutaneous QHS  . isosorbide dinitrate  20 mg Oral BID  . levothyroxine  100 mcg Oral Daily  .  nystatin cream  1 application Topical BID  . pantoprazole  40 mg Oral Q1200  . potassium chloride  10 mEq Intravenous Once  . potassium chloride  10 mEq Intravenous Q1 Hr x 3  . potassium chloride  20 mEq Oral Once  . senna  1 tablet Oral Daily   Continuous Infusions:   . sodium chloride 500 mL (08/08/11 1054)  . sodium chloride     PRN Meds:.acetaminophen, colchicine, Muscle Rub, nitroGLYCERIN, ondansetron (ZOFRAN) IV, oxyCODONE, polyethylene glycol powder, prochlorperazine   Assessment/Plan:  Principal Problem:  *UTI (lower urinary tract infection)-continue renally dosed cipro. Active Problems:  Hypokalemia-worse.  Replace with IV and oral dosing. Check daily. We have to be  careful with severe CKD. ESRD , under hospice care.  I will actually totally d/c lasix at this point. Continue low dose IV fluids for another day or so.  Defer to PCP when to resume any lasix. DNR.   LOS: 2 days   Ezequiel Kayser, MD 08/08/2011, 11:14 AM

## 2011-08-08 NOTE — Progress Notes (Signed)
ANTIBIOTIC CONSULT NOTE - INITIAL  Pharmacy Consult for Cipro Indication: UTI  Allergies  Allergen Reactions  . Codeine   . Hydrocodone   . Penicillins   . Sulfa Antibiotics    Patient Measurements: Weight: 202 lb 6.1 oz (91.8 kg) Labs:  [facility] 08/08/11 0437 08/07/11 0904 08/06/11 1807  WBC 9.3 11.2* 7.9  HGB 10.6* 12.2 11.8*  PLT 263 268 257  LABCREA -- -- --  CREATININE 3.96* 4.09* 4.80*  6/16 Calculated CrCl (n) ~ 12 ml/min  Microbiology: 6/14 Urine cxt: pending 6/14 Blood cxt: pending   Medications:  Anti-infectives     Start     Dose/Rate Route Frequency Ordered Stop   08/07/11 0900   ciprofloxacin (CIPRO) IVPB 200 mg     Comments: Dose per Pharmacy if 200 mg iv once daily is not the right dose given her renal function. Thank you.      200 mg 100 mL/hr over 60 Minutes Intravenous Every 24 hours 08/07/11 0757     08/06/11 1915   cefTRIAXone (ROCEPHIN) 1 g in dextrose 5 % 50 mL IVPB        1 g 100 mL/hr over 30 Minutes Intravenous  Once 08/06/11 1903 08/06/11 2012         Assessment:  78 yo F with acute on chronic renal failure admit with r/o UTI  Cipro dose ordered is appropriate for renal function at this time, pharmacy will follow  Blood and urine cultures pending  Plan:   Continue Cipro 200mg  IV q24h as ordered  Follow up renal fxn and culture results.   Lynann Beaver PharmD, BCPS Pager [phone] 08/08/2011 7:53 AM

## 2011-08-08 NOTE — Progress Notes (Signed)
Pt. Sleepy today. Slept through breakfast. She is easily arousable. Family in to visit, helping pt. to eat lunch. K-2.5, pt. Received 3 runs of potassium, Dr Waynard Edwards also starting her on oral potassium. Foley patent and draining yellow urine. Repositioned q2hr. No complaints voiced. Appears to be resting comfortaby.

## 2011-08-08 NOTE — Progress Notes (Signed)
DNR.  Related admission.  Patient in the bed sleeping soundly.  Difficult to arouse this am.  Potassium 2.5 this am.  New orders on chart.  Patient does not appear to be in any pain at this time.  Chart reviewed and spoke with Arline Asp, RN.  Please call [facility] @ [phone] with any questions or concerns.

## 2011-08-08 NOTE — Progress Notes (Signed)
Critical value of potassium 2.5 called by lab this am. Dr Waynard Edwards notified and new orders received.

## 2011-08-09 DIAGNOSIS — E1129 Type 2 diabetes mellitus with other diabetic kidney complication: Secondary | ICD-10-CM | POA: Diagnosis present

## 2011-08-09 DIAGNOSIS — N189 Chronic kidney disease, unspecified: Secondary | ICD-10-CM | POA: Diagnosis present

## 2011-08-09 DIAGNOSIS — I10 Essential (primary) hypertension: Secondary | ICD-10-CM | POA: Diagnosis present

## 2011-08-09 DIAGNOSIS — N179 Acute kidney failure, unspecified: Secondary | ICD-10-CM | POA: Diagnosis present

## 2011-08-09 DIAGNOSIS — E860 Dehydration: Secondary | ICD-10-CM | POA: Diagnosis present

## 2011-08-09 LAB — CBC
HCT: 34.1 % — ABNORMAL LOW (ref 36.0–46.0)
Hemoglobin: 11.1 g/dL — ABNORMAL LOW (ref 12.0–15.0)
MCH: 31.6 pg (ref 26.0–34.0)
MCHC: 32.6 g/dL (ref 30.0–36.0)
MCV: 97.2 fL (ref 78.0–100.0)
RDW: 15.2 % (ref 11.5–15.5)

## 2011-08-09 LAB — URINE CULTURE: Colony Count: >=100000

## 2011-08-09 LAB — COMPREHENSIVE METABOLIC PANEL
ALT: 12 U/L (ref 0–35)
AST: 17 U/L (ref 0–37)
Albumin: 2.7 g/dL — ABNORMAL LOW (ref 3.5–5.2)
Alkaline Phosphatase: 128 U/L — ABNORMAL HIGH (ref 39–117)
Calcium: 9.6 mg/dL (ref 8.4–10.5)
GFR calc Af Amer: 13 mL/min — ABNORMAL LOW (ref >90)
Potassium: 3.1 mEq/L — ABNORMAL LOW (ref 3.5–5.1)
Sodium: 142 mEq/L (ref 135–145)
Total Protein: 6.2 g/dL (ref 6.0–8.3)

## 2011-08-09 LAB — GLUCOSE, CAPILLARY: Glucose-Capillary: 150 mg/dL — ABNORMAL HIGH (ref 70–99)

## 2011-08-09 MED ORDER — POTASSIUM CHLORIDE CRYS ER 20 MEQ PO TBCR
40.0000 meq | EXTENDED_RELEASE_TABLET | Freq: Two times a day (BID) | ORAL | Status: AC
  Administered 2011-08-09 (×2): 40 meq via ORAL
  Filled 2011-08-09 (×2): qty 2

## 2011-08-09 NOTE — Progress Notes (Addendum)
Room 1313 - Carrie [name]  [facility]  [facility] RN Visit  CC: pt admitted from [facility] ALF with complaints of altered level of consciousness,  Dehydration, UTI.  Related admission to [facility] diagnosis of ESRD.  Pt is DNR code with OOF DNR on shadow chart.    Pt arousable and confused (denied seeing PCP this am or since hospitalized,  Denied family has been to visit-EPIC documentation supports visits) sitting up in lounge chair, without complaints of pain- pt states she is very tired and wants to be put back to bed-staff RN alerted to request.  Pt dozed during mid sentence during visit and is slow to respond.  No family present.    Please call [facility] @ [phone] with any hospice needs or questions.  Thank you.  Joneen Boers, RN  [facility]  Hospice Liaison

## 2011-08-09 NOTE — Progress Notes (Signed)
Subjective: "I need some water".  No other complaints.  Denies shortness of breath, edema.  Objective: Vital signs in last 24 hours: Temp:  [97.8 F (36.6 C)-98.8 F (37.1 C)] 97.8 F (36.6 C) (06/17 0535) Pulse Rate:  [54-61] 57  (06/17 0535) Resp:  [17-18] 18  (06/17 0535) BP: (131-172)/(59-73) 172/73 mmHg (06/17 0535) SpO2:  [90 %-95 %] 94 % (06/17 0535) Weight change:  Last BM Date: 08/07/11  CBG (last 3)   Basename 08/09/11 0727 08/08/11 2129 08/08/11 1727  GLUCAP 109* 203* 118*    Intake/Output from previous day: 06/16 0701 - 06/17 0700 In: 1971.5 [P.O.:1160; I.V.:711.5; IV Piggyback:100] Out: 2051 [Urine:2050; Stool:1] Intake/Output this shift:    General appearance: chronically ill, fatigued, dry Eyes: no scleral icterus Throat: oropharynx dry Resp: clear to auscultation bilaterally Cardio: regular rate and rhythm with frequent ectopy; grade 3/6 SEM GI: soft, non-tender; bowel sounds normal; no masses,  no organomegaly Extremities: no clubbing, cyanosis or edema; decreased skin turgor   Lab Results:  Basename 08/09/11 0405 08/08/11 0437 08/06/11 2120  NA 142 143 --  K 3.1* 2.5* --  CL 97 95* --  CO2 33* 33* --  GLUCOSE 117* 128* --  BUN 100* 110* --  CREATININE 3.60* 3.96* --  CALCIUM 9.6 9.5 --  MG -- -- 1.8  PHOS -- -- --    Basename 08/09/11 0405 08/08/11 0437  AST 17 16  ALT 12 12  ALKPHOS 128* 125*  BILITOT 0.5 0.5  PROT 6.2 6.1  ALBUMIN 2.7* 2.7*    Basename 08/09/11 0405 08/08/11 0437 08/06/11 1807  WBC 9.1 9.3 --  NEUTROABS -- -- 6.1  HGB 11.1* 10.6* --  HCT 34.1* 31.9* --  MCV 97.2 96.1 --  PLT 273 263 --    Studies/Results: No results found.   Medications: Scheduled:   . amLODipine  5 mg Oral Daily  . antiseptic oral rinse  15 mL Mouth Rinse BID  . aspirin  81 mg Oral Daily  . calcitRIOL  0.25 mcg Oral Daily  . carvedilol  6.25 mg Oral BID WC  . ciprofloxacin  200 mg Intravenous Q24H  . cloNIDine  0.1 mg Oral BID  .  dextromethorphan-guaiFENesin  1 tablet Oral Q12H  . docusate sodium  100 mg Oral BID  . enoxaparin  30 mg Subcutaneous Q24H  . escitalopram  20 mg Oral QHS  . folic acid-pyridoxine-cyancobalamin  1 tablet Oral Daily  . gabapentin  100 mg Oral TID  . hydrALAZINE  25 mg Oral Q8H  . insulin glargine  20 Units Subcutaneous QHS  . isosorbide dinitrate  20 mg Oral BID  . levothyroxine  100 mcg Oral Daily  . nystatin cream  1 application Topical BID  . pantoprazole  40 mg Oral Q1200  . potassium chloride  10 mEq Intravenous Q1 Hr x 3  . potassium chloride  20 mEq Oral BID  . senna  1 tablet Oral Daily  . DISCONTD: furosemide  160 mg Oral Daily   Continuous:   . sodium chloride 500 mL (08/08/11 1054)  . sodium chloride      Assessment/Plan: Principal Problem: 1. *Dehydration- she remains dry.  Lasix held.  Continue IV fluid hydration (125cc/hr) and encourage pos. Active Problems: 2. Hypokalemia- remains low- replete orally today and monitor. Check Magnesium. 3. UTI (lower urinary tract infection)- continue Cipro.   4. Acute on chronic renal failure- nearing end-stage, declines dialysis.  Improved slightly with hydration consistent with prerenal component in  setting of UTI and diuretic therapy.  Monitor with continued hydration.   5. Hypertension- continue home meds.   6. DM (diabetes mellitus), type 2 with renal complications- adequate control on Lantus/SSI. 7. Disposition- anticipate discharge back to ALF in 1-2 days if improvement continues with hydration and maintaining po intake.  D/C foley.  Out of bed. Continue palliative care.    LOS: 3 days   Augustus Zurawski,W DOUGLAS 08/09/2011, 7:32 AM

## 2011-08-09 NOTE — Clinical Social Work Note (Signed)
[facility] - Social Work Note SW following pt at [facility].  Pt to return to facility when medically ready.  Will follow-up with hospital SW.  Pt sleeping in bed with no nonverbal indicators of pain.  Please call if questions. Louie Boston, LCSW [phone]

## 2011-08-09 NOTE — Progress Notes (Signed)
Nutrition Brief Note  - Pt screened for nutrition risk and low braden. Pt followed by hospice for 3 months PTA and is getting palliative care during admission. Hospice RN following during admission. Nutrition signing off. Please consult if needed.   Dietitian# [phone]

## 2011-08-10 LAB — COMPREHENSIVE METABOLIC PANEL
ALT: 12 U/L (ref 0–35)
Alkaline Phosphatase: 130 U/L — ABNORMAL HIGH (ref 39–117)
BUN: 87 mg/dL — ABNORMAL HIGH (ref 6–23)
Chloride: 99 mEq/L (ref 96–112)
GFR calc Af Amer: 16 mL/min — ABNORMAL LOW (ref >90)
Glucose, Bld: 149 mg/dL — ABNORMAL HIGH (ref 70–99)
Potassium: 3.6 mEq/L (ref 3.5–5.1)
Sodium: 140 mEq/L (ref 135–145)
Total Bilirubin: 0.5 mg/dL (ref 0.3–1.2)
Total Protein: 6.1 g/dL (ref 6.0–8.3)

## 2011-08-10 LAB — CBC
HCT: 33.9 % — ABNORMAL LOW (ref 36.0–46.0)
MCHC: 31.9 g/dL (ref 30.0–36.0)
MCV: 98 fL (ref 78.0–100.0)
Platelets: 270 10*3/uL (ref 150–400)
RDW: 15.2 % (ref 11.5–15.5)

## 2011-08-10 LAB — GLUCOSE, CAPILLARY: Glucose-Capillary: 178 mg/dL — ABNORMAL HIGH (ref 70–99)

## 2011-08-10 MED ORDER — HYDRALAZINE HCL 50 MG PO TABS
50.0000 mg | ORAL_TABLET | Freq: Three times a day (TID) | ORAL | Status: DC
  Administered 2011-08-10 – 2011-08-11 (×3): 50 mg via ORAL
  Filled 2011-08-10 (×6): qty 1

## 2011-08-10 MED ORDER — POTASSIUM CHLORIDE CRYS ER 20 MEQ PO TBCR
40.0000 meq | EXTENDED_RELEASE_TABLET | Freq: Once | ORAL | Status: AC
  Administered 2011-08-10: 40 meq via ORAL
  Filled 2011-08-10: qty 2

## 2011-08-10 MED ORDER — DEXTROSE 5 % IV SOLN
1.0000 g | INTRAVENOUS | Status: DC
  Administered 2011-08-10 – 2011-08-12 (×3): 1 g via INTRAVENOUS
  Filled 2011-08-10 (×3): qty 10

## 2011-08-10 MED ORDER — MAGNESIUM SULFATE 40 MG/ML IJ SOLN
2.0000 g | Freq: Once | INTRAMUSCULAR | Status: AC
  Administered 2011-08-10: 2 g via INTRAVENOUS
  Filled 2011-08-10: qty 50

## 2011-08-10 NOTE — Progress Notes (Signed)
ANTIBIOTIC CONSULT NOTE - follow up  Pharmacy Consult for Cipro Indication: UTI  Allergies  Allergen Reactions  . Codeine   . Hydrocodone   . Penicillins   . Sulfa Antibiotics    Patient Measurements: Height: 5\' 8"  (172.7 cm) Weight: 202 lb 6.1 oz (91.8 kg) IBW/kg (Calculated) : 63.9  Labs:  Basename 08/10/11 0343 08/09/11 0405 08/08/11 0437  WBC 9.2 9.1 9.3  HGB 10.8* 11.1* 10.6*  PLT 270 273 263  LABCREA -- -- --  CREATININE 3.03* 3.60* 3.96*  6/16 Calculated CrCl (n) ~ 12 ml/min  Microbiology: 6/14 Urine cxt: Ivan Croft 6/14 Blood cxt: NGTD   Medications:  Anti-infectives     Start     Dose/Rate Route Frequency Ordered Stop   08/07/11 0900   ciprofloxacin (CIPRO) IVPB 200 mg     Comments: Dose per Pharmacy if 200 mg iv once daily is not the right dose given her renal function. Thank you.      200 mg 100 mL/hr over 60 Minutes Intravenous Every 24 hours 08/07/11 0757     08/06/11 1915   cefTRIAXone (ROCEPHIN) 1 g in dextrose 5 % 50 mL IVPB        1 g 100 mL/hr over 30 Minutes Intravenous  Once 08/06/11 1903 08/06/11 2012         Assessment:  78 yo F with acute on chronic renal failure admit with UTI  Cipro Day #4 for Ecoli UTI - note that Ecoli is resistant to flouroquinolones   Plan:  Recommend changing antibiotics to Rocephin per sensitivities (PCN allergy ntoes). Based on renal function, would not recommend other options of aminoglycosides and nitrofurantoin is contraindicated currently   Hessie Knows, PharmD, BCPS Pager [phone] 08/10/2011 8:41 AM

## 2011-08-10 NOTE — Progress Notes (Signed)
Dr Charlies Silvers on call aware of patient's BP of 167/51,PR 50, told this nurse to give 2pm dose of Hydralazine.

## 2011-08-10 NOTE — Progress Notes (Signed)
Chaplain's Note: Pt was sitting up in chair asleep, mouth opened and snoring, using oxygen.  Pt feet elevated with pillow support under legs.  Chaplain provided pastoral presence, pastoral touch and had prayer of comfort / peace for pt.  Pt remain asleep. Carrie [name] Joy., [facility]

## 2011-08-10 NOTE — Progress Notes (Signed)
Room 1313 - Malayasia Mirkin [facility]  [facility] RN Visit  Related admission to [facility] diagnosis of chronic kidney disease.   Pt is DNR code with OOF DNR on shadow chart.    Pt just finished getting bath/bed change.  Pt lying in bed, pleasantly confused with complaints of headache and her nose hurts.    No family present.    PT present to work with patient - unable to get to chair - states they must use lift for transfers.  ATTENTION Dr. Clelia Croft;   [facility] will administer Rocephin IM daily as needed if ALF is unable to accommodate per [facility]  RN Director Marisue Humble.  Please write order/script at discharge.     Please call [facility] @ [phone] with any needs.  Thank you.  Joneen Boers, RN  [facility]  Hospice Liaison

## 2011-08-10 NOTE — Clinical Documentation Improvement (Signed)
CHANGE MENTAL STATUS DOCUMENTATION CLARIFICATION   THIS DOCUMENT IS NOT A PERMANENT PART OF THE MEDICAL RECORD  TO RESPOND TO THE THIS QUERY, FOLLOW THE INSTRUCTIONS BELOW:  1. If needed, update documentation for the patient's encounter via the notes activity.  2. Access this query again and click edit on the In [employer].  3. After updating, or not, click F2 to complete all highlighted (required) fields concerning your review. Select "additional documentation in the medical record" OR "no additional documentation provided".  4. Click Sign note button.  5. The deficiency will fall out of your In Basket *Please let us know if you are not able to complete this workflow by phone or e-mail (listed below).         08/10/11  Dear Dr. Clelia Croft, [name]  In an effort to better capture your patient's severity of illness, reflect appropriate length of stay and utilization of resources, a review of the patient medical record has revealed the following indicators.    Based on your clinical judgment, please clarify and document in a progress note and/or discharge summary the clinical condition associated with the following supporting information:  In responding to this query please exercise your independent judgment.  The fact that a query is asked, does not imply that any particular answer is desired or expected.  Possible Clinical Conditions?  _______Encephalopathy (describe type if known)                       Anoxic                       Septic                       Alcoholic                        Hepatic                       Hypertensive                       Metabolic                       _______ Toxic _______Traumatic brain injury  _______Other Condition__________________ _______Cannot Clinically Determine   Supporting Information: PN 08/08/11 Difficult to arouse this am.  Potassium 2.5 this am.  New orders on chart.   HP HPI: Carrie [name] is a pleasant 78 year old female with  multiple severe medical problems.  She is essentially at ESRD level and she has been followed by hospice for the last 3 months.  She is not going to get hemodialysis.  She reaffirms this today.  She had worsened mental status and presented to the ER.   Risk Factors: UTI, ESRD, HTN, HLD, CKD, ARF Signs & Symptoms:  Diagnostics: Component     Latest Ref Rng 08/09/2011         4:05 AM  Glucose     70 - 99 mg/dL 454 (H)  BUN     6 - 23 mg/dL 098 (H)   Component     Latest Ref Rng 08/10/2011         3:43 AM  Glucose     70 - 99 mg/dL 119 (H)  BUN     6 - 23 mg/dL 87 (H)   Component  Latest Ref Rng 08/07/2011         9:04 AM  GFR calc non Af Amer     >90 mL/min 10 (L)   Component     Latest Ref Rng 08/08/2011         4:37 AM  GFR calc non Af Amer     >90 mL/min 10 (L)   Treatment: Cipro Reviewed:  no additional documentation provided  Thank You,  Enis Slipper  RN, BSN, CCDS Clinical Documentation Specialist [facility] HIM Dept Pager: [phone] / E-mail: [email or web address]  [employer]

## 2011-08-10 NOTE — Progress Notes (Signed)
Occupational Therapy Evaluation Patient Details Name: JAKERA BEAUPRE MRN: [id number] DOB: [birth year] Today's Date: 08/10/2011 Time: 8295-6213 OT Time Calculation (min): 29 min  OT Assessment / Plan / Recommendation Clinical Impression  This 78 year old female was admitted from ALF with AMS, uti and dehydration.  She has not walked in some time and states she had assistance for ADLs.  Pt is very deconditioned.  Will perform trial of OT to see if she will benefit in acute.    OT Assessment  Patient needs continued OT Services    Follow Up Recommendations  Skilled nursing facility    Barriers to Discharge      Equipment Recommendations  Defer to next venue    Recommendations for Other Services    Frequency  Min 1X/week    Precautions / Restrictions Precautions Precautions: Fall Restrictions Weight Bearing Restrictions: No   Pertinent Vitals/Pain C/o headache and nose pain--then back pain when RN brought tylenol    ADL  Eating/Feeding: Simulated;Set up Where Assessed - Eating/Feeding: Bed level Grooming: Performed;Wash/dry face;Minimal assistance Where Assessed - Grooming: Unsupported sitting Upper Body Bathing: Simulated;+1 Total assistance Where Assessed - Upper Body Bathing: Supine, head of bed up Lower Body Bathing: Simulated;+2 Total assistance Lower Body Bathing: Patient Percentage: 0% Where Assessed - Lower Body Bathing: Rolling right and/or left Upper Body Dressing: Simulated;+1 Total assistance Where Assessed - Upper Body Dressing: Supine, head of bed up Lower Body Dressing: Simulated;+2 Total assistance Lower Body Dressing: Patient Percentage: 0% Where Assessed - Lower Body Dressing: Rolling right and/or left Toilet Transfer:  (attempted:  unable even with Stedy) Toileting - Clothing Manipulation and Hygiene: Simulated;+2 Total assistance Toileting - Clothing Manipulation and Hygiene: Patient Percentage: 0% Equipment Used: Gait belt;Other (comment)  (attempted stedy) ADL Comments: pt very deconditioned.  Initially sat EOB with supervision, as she became more fatiqued, she drifted slowly to right requiring support to sit at midline     OT Diagnosis: Generalized weakness  OT Problem List: Decreased strength;Decreased activity tolerance;Impaired balance (sitting and/or standing);Decreased cognition;Obesity;Pain OT Treatment Interventions: Self-care/ADL training;Therapeutic activities;Balance training;Patient/family education   OT Goals Acute Rehab OT Goals OT Goal Formulation: Patient unable to participate in goal setting Time For Goal Achievement: 08/24/11 Potential to Achieve Goals: Fair Miscellaneous OT Goals Miscellaneous OT Goal #1: Pt will roll to each side with rails with mod A for ADLS OT Goal: Miscellaneous Goal #1 - Progress: Goal set today Miscellaneous OT Goal #2: Pt will sit unsupported EOB x 5 minutes with min guard A in prep for seated ADLs OT Goal: Miscellaneous Goal #2 - Progress: Goal set today Miscellaneous OT Goal #3: Pt will perform 3 grooming tasks in supported sitting with set up/supervision and min cues OT Goal: Miscellaneous Goal #3 - Progress: Goal set today  Visit Information  Last OT Received On: 08/10/11 Assistance Needed: +2 PT/OT Co-Evaluation/Treatment: Yes    Subjective Data  Subjective: "I don't remember the name of the place now (ALF)" Patient Stated Goal: none stated.  Agreeable to OT/PT   Prior Functioning  Home Living Lives With:  (alf) Available Help at Discharge:  (staff) Prior Function Level of Independence: Needs assistance Communication Communication: No difficulties Dominant Hand:  (left has greater ROM)    Cognition  Overall Cognitive Status: No family/caregiver present to determine baseline cognitive functioning Arousal/Alertness: Awake/alert Orientation Level: Disoriented to;Place;Time;Situation Behavior During Session: Flat affect Cognition - Other Comments: pt. fartgued  during treatment and closed eyed frequently    Extremity/Trunk Assessment Right Upper Extremity Assessment  RUE ROM/Strength/Tone: Deficits RUE ROM/Strength/Tone Deficits: R shoulder very limited, AROM, approximately 20 degrees in sidelying and unable to lift against gravity.  Krepitious.  Distally, WFL AROM Left Upper Extremity Assessment LUE ROM/Strength/Tone: Within functional levels (strength grossly 3- to 3/5) Right Lower Extremity Assessment RLE ROM/Strength/Tone: Deficits RLE ROM/Strength/Tone Deficits: ROM WFL, but pt with diffuse muscle atophy with strength ~ 2/5 Left Lower Extremity Assessment LLE ROM/Strength/Tone: Deficits LLE ROM/Strength/Tone Deficits: WFL ROM, but strength ~2/5 and diffuse muscle atophy Trunk Assessment Trunk Assessment: Other exceptions (obese)   Mobility Bed Mobility Bed Mobility: Rolling Right;Rolling Left Rolling Right: 2: Max assist Rolling Left: 2: Max assist Details for Bed Mobility Assistance: pt needs cues to position leg to push and reach for rails with arms.  Not able to reach well with right arm Transfers Sit to Stand: 1: +2 Total assist Sit to Stand: Patient Percentage: 10% Stand to Sit: 1: +2 Total assist Stand to Sit: Patient Percentage: 10% Transfer via Lift Equipment:  (recommend maximove unable to use stedy) Details for Transfer Assistance: pt was able to sit on EOB , but was unable to learn forward and begin to extend trunk and hips to assist with standing.  pt requires lift equipment   Exercise    Balance    End of Session OT - End of Session Activity Tolerance: Patient limited by fatigue Patient left: in bed;with call bell/phone within reach   [facility] 08/10/2011, 2:26 PM Marica Otter, OTR/L [phone] 08/10/2011

## 2011-08-10 NOTE — Progress Notes (Signed)
Subjective: "I feel worse because they aren't giving me enough water"- no other specific complaints.  Remains very weak.  Objective: Vital signs in last 24 hours: Temp:  [98.2 F (36.8 C)-98.3 F (36.8 C)] 98.2 F (36.8 C) (06/18 0604) Pulse Rate:  [65-74] 74  (06/18 0604) Resp:  [17-18] 18  (06/18 0604) BP: (148-192)/(66-72) 192/66 mmHg (06/18 0604) SpO2:  [93 %-94 %] 94 % (06/18 0604) Weight change:  Last BM Date: 08/09/11  CBG (last 3)   Basename 08/10/11 0757 08/09/11 2156 08/09/11 1711  GLUCAP 143* 220* 150*    Intake/Output from previous day: 06/17 0701 - 06/18 0700 In: 1863.9 [P.O.:840; I.V.:923.9; IV Piggyback:100] Out: 700 [Urine:700] Intake/Output this shift:    General appearance: fatigued and chronically ill Eyes: no scleral icterus Throat: oropharynx remains dry, improved Resp: clear to auscultation bilaterally Cardio: regular rate and rhythm, S1, S2 normal, no murmur, click, rub or gallop GI: soft, non-tender; bowel sounds normal; no masses,  no organomegaly Extremities: no clubbing, cyanosis; minimal edema   Lab Results:  [facility] 08/10/11 0343 08/09/11 0405  NA 140 142  K 3.6 3.1*  CL 99 97  CO2 30 33*  GLUCOSE 149* 117*  BUN 87* 100*  CREATININE 3.03* 3.60*  CALCIUM 9.3 9.6  MG 1.5 --  PHOS -- --    Basename 08/10/11 0343 08/09/11 0405  AST 16 17  ALT 12 12  ALKPHOS 130* 128*  BILITOT 0.5 0.5  PROT 6.1 6.2  ALBUMIN 2.6* 2.7*    Basename 08/10/11 0343 08/09/11 0405  WBC 9.2 9.1  NEUTROABS -- --  HGB 10.8* 11.1*  HCT 33.9* 34.1*  MCV 98.0 97.2  PLT 270 273   No results found for this basename: INR, PROTIME   No results found for this basename: CKTOTAL:3,CKMB:3,CKMBINDEX:3,TROPONINI:3 in the last 72 hours No results found for this basename: TSH,T4TOTAL,FREET3,T3FREE,THYROIDAB in the last 72 hours No results found for this basename: VITAMINB12:2,FOLATE:2,FERRITIN:2,TIBC:2,IRON:2,RETICCTPCT:2 in the last 72  hours  Studies/Results: No results found.   Medications: Scheduled:   . amLODipine  5 mg Oral Daily  . antiseptic oral rinse  15 mL Mouth Rinse BID  . aspirin  81 mg Oral Daily  . calcitRIOL  0.25 mcg Oral Daily  . carvedilol  6.25 mg Oral BID WC  . ciprofloxacin  200 mg Intravenous Q24H  . cloNIDine  0.1 mg Oral BID  . dextromethorphan-guaiFENesin  1 tablet Oral Q12H  . docusate sodium  100 mg Oral BID  . enoxaparin  30 mg Subcutaneous Q24H  . escitalopram  20 mg Oral QHS  . folic acid-pyridoxine-cyancobalamin  1 tablet Oral Daily  . gabapentin  100 mg Oral TID  . hydrALAZINE  25 mg Oral Q8H  . insulin glargine  20 Units Subcutaneous QHS  . isosorbide dinitrate  20 mg Oral BID  . levothyroxine  100 mcg Oral Daily  . nystatin cream  1 application Topical BID  . pantoprazole  40 mg Oral Q1200  . potassium chloride  40 mEq Oral BID  . senna  1 tablet Oral Daily   Continuous:   . sodium chloride 125 mL/hr at 08/09/11 1609    Assessment/Plan: 1. *Dehydration- slowly improving. Lasix held. Continue IV fluid hydration (125cc/hr) and encourage pos.  Active Problems:  2. Hypokalemia- improved- replete orally today and monitor. Replete Magnesium.  3. UTI (lower urinary tract infection)- E.coli resistant to Cipro.  Will change to Rocephin IV/IM X 7 days- will have CM/SW determine if patient can receive IM  Rocephin at ALF via Hospice or ALF services.  If not, will need PICC line (Ok to place PICC despite CRI since she has declined dialysis) 4. Acute on chronic renal failure- nearing end-stage, declines dialysis. Improved slightly with hydration consistent with prerenal component in setting of UTI and diuretic therapy. Monitor with continued hydration.  5. Hypertension- BP elevated.  Will increase Hydralazine to 50mg  q8 hours.  Continue other medications. 6. DM (diabetes mellitus), type 2 with renal complications- adequate control on Lantus/SSI.  7. Disposition- anticipate discharge  back to ALF in 1-2 days if improvement continues with hydration and maintaining po intake. Continue palliative care.  PT/OT consult.     LOS: 4 days   Rakiyah Esch,W DOUGLAS 08/10/2011, 8:29 AM

## 2011-08-10 NOTE — Evaluation (Signed)
Physical Therapy Evaluation Patient Details Name: Carrie [name] MRN: [id number] DOB: 24-Jul-1929 Today's Date: 08/10/2011 Time: 1040-1110 PT Time Calculation (min): 30 min  PT Assessment / Plan / Recommendation Clinical Impression  78 yo with limited moblity prior to admission admitted with UTI  Anticipate she will continue to need lift equipment for transfers, but may benefit from trial of PT to increase LE strength to decrease burden of care in bed mobility     PT Assessment  Patient needs continued PT services    Follow Up Recommendations  Skilled nursing facility    Barriers to Discharge        lEquipment Recommendations       Recommendations for Other Services     Frequency Min 2X/week    Precautions / Restrictions     Pertinent Vitals/Pain Pt complains of headache and back pain      Mobility  Bed Mobility Bed Mobility: Rolling Right;Rolling Left Rolling Right: 2: Max assist Rolling Left: 2: Max assist Details for Bed Mobility Assistance: pt needs cues to position leg to push and reach for rails with arms.  Not able to reach well with right arm Transfers Transfers: Sit to Stand;Stand to Sit;Squat Pivot Transfers Sit to Stand: 1: +2 Total assist (attempted multiple times, pt unable to bear weight) Sit to Stand: Patient Percentage: 10% Stand to Sit: 1: +2 Total assist Stand to Sit: Patient Percentage: 10% Squat Pivot Transfers: 1: +2 Total assist Squat Pivot Transfers: Patient Percentage: 10% Transfer via Lift Equipment:  (recommend maximove unable to use stedy) Details for Transfer Assistance: pt was able to sit on EOB , but was unable to learn forward and begin to extend trunk and hips to assist with standaing.  pt requires lift equipment Ambulation/Gait Ambulation/Gait Assistance: Not tested (comment) (unable)    Exercises     PT Diagnosis: Generalized weakness  PT Problem List: Decreased strength;Decreased activity tolerance;Decreased mobility PT  Treatment Interventions: Functional mobility training;Therapeutic exercise   PT Goals Acute Rehab PT Goals PT Goal Formulation: Patient unable to participate in goal setting Time For Goal Achievement: 08/24/11 Potential to Achieve Goals: Fair Pt will Roll Supine to Right Side: with mod assist PT Goal: Rolling Supine to Right Side - Progress: Goal set today Pt will Roll Supine to Left Side: with mod assist PT Goal: Rolling Supine to Left Side - Progress: Goal set today Pt will go Supine/Side to Sit: with mod assist PT Goal: Supine/Side to Sit - Progress: Goal set today  Visit Information  Last PT Received On: 08/10/11 PT/OT Co-Evaluation/Treatment: Yes    Subjective Data      Prior Functioning  Home Living Lives With:  (alf) Prior Function Level of Independence: Needs assistance Communication Communication: No difficulties    Cognition  Overall Cognitive Status: No family/caregiver present to determine baseline cognitive functioning Arousal/Alertness: Awake/alert Orientation Level: Disoriented X4 Behavior During Session: Flat affect Cognition - Other Comments: pt. fartgued during treatment and closed eyed frequently    Extremity/Trunk Assessment Right Lower Extremity Assessment RLE ROM/Strength/Tone: Deficits RLE ROM/Strength/Tone Deficits: ROM WFL, but pt with diffuse muscle atophy with strength ~ 2/5 Left Lower Extremity Assessment LLE ROM/Strength/Tone: Deficits LLE ROM/Strength/Tone Deficits: WFL ROM, but strength ~2/5 and diffuse muscle atophy Trunk Assessment Trunk Assessment: Other exceptions (obese)   Balance    End of Session PT - End of Session Equipment Utilized During Treatment: Gait belt Activity Tolerance: Patient limited by fatigue Patient left: in chair;with nursing in room Nurse Communication: Need for lift  equipment   Donnetta Hail 08/10/2011, 11:31 AM

## 2011-08-11 DIAGNOSIS — G9341 Metabolic encephalopathy: Secondary | ICD-10-CM | POA: Diagnosis present

## 2011-08-11 LAB — CBC
MCHC: 32 g/dL (ref 30.0–36.0)
Platelets: 256 10*3/uL (ref 150–400)
RDW: 15.3 % (ref 11.5–15.5)

## 2011-08-11 LAB — COMPREHENSIVE METABOLIC PANEL
ALT: 12 U/L (ref 0–35)
Alkaline Phosphatase: 130 U/L — ABNORMAL HIGH (ref 39–117)
BUN: 68 mg/dL — ABNORMAL HIGH (ref 6–23)
CO2: 27 mEq/L (ref 19–32)
GFR calc Af Amer: 19 mL/min — ABNORMAL LOW (ref >90)
GFR calc non Af Amer: 17 mL/min — ABNORMAL LOW (ref >90)
Glucose, Bld: 148 mg/dL — ABNORMAL HIGH (ref 70–99)
Potassium: 3.8 mEq/L (ref 3.5–5.1)
Sodium: 139 mEq/L (ref 135–145)

## 2011-08-11 LAB — GLUCOSE, CAPILLARY
Glucose-Capillary: 147 mg/dL — ABNORMAL HIGH (ref 70–99)
Glucose-Capillary: 124 mg/dL — ABNORMAL HIGH (ref 70–99)

## 2011-08-11 MED ORDER — HYDRALAZINE HCL 50 MG PO TABS
100.0000 mg | ORAL_TABLET | Freq: Three times a day (TID) | ORAL | Status: DC
  Administered 2011-08-11 – 2011-08-12 (×4): 100 mg via ORAL
  Filled 2011-08-11 (×6): qty 2

## 2011-08-11 NOTE — Progress Notes (Signed)
Room 1313 - Zineb Glade  [facility]  [facility] RN Visit  Related admission to [facility] diagnosis of chronic kidney disease.  Pt is DNR code with OOF DNR on shadow chart.    Pt lying in bed, sleeping with difficulty arousing - pt has had nausea this am.   Dtr at bedside and admits pt appears comfortable sleeping.  Per dtr, pt to go to SNF-Rehab tomorrow - [facility].   Pt will revoke Hospice benefits - [facility] SW will be in contact with dtr.   Will recommend [facility] in [place] from [facility]-discussed with dtr.  Lengthy discussion with dtr on her decisions for comfort care only - affirming her decisions that have come from discussions in the past with patient about her wishes.   Will follow pt tomorrow prior to discharge and encouraged dtr to call [facility] with any questions or concerns.    Please call [facility] @ [phone] with any needs.  Thank you.  Joneen Boers, RN  [facility]  Hospice Liaison

## 2011-08-11 NOTE — Progress Notes (Signed)
CARE MANAGEMENT NOTE 08/11/2011  Patient:  Carrie [name], Carrie [name]   Account Number:  [id number]  Date Initiated:  08/11/2011  Documentation initiated by:  Lain Tetterton  Subjective/Objective Assessment:   78 yo female admitted 08/06/11 with fever.     Action/Plan:   D/C when medically stable   Anticipated DC Date:  08/14/2011   Anticipated DC Plan:  SKILLED NURSING FACILITY  In-house referral  Clinical Social Worker      DC Planning Services  CM consult      [facility] Choice  Resumption Of Svcs/PTA Provider             Status of service:  Completed, signed off  Discharge Disposition:  SKILLED NURSING FACILITY  Per UR Regulation:  Reviewed for med. necessity/level of care/duration of stay               Comments:  08/11/11, Kathi Der RNC-MNN, BSN, [phone], CM received referral for [facility] services.  Plan at this time is for pt to go to SNF.  CSW aware and plan is to discharge to SNF when medically stable.  CM signing off.

## 2011-08-11 NOTE — Progress Notes (Signed)
Subjective: Feels poorly this am with increased nausea, abdominal pain.  Slight increase in shortness of breath.  O2 increased from 2-->3L due to decreased O2 sats.  Increased confusion this am.  Daughter at bedside and reiterates goals of therapy are for comfort.  Does not think she can return to ALF due to needs for assistance.  Objective: Vital signs in last 24 hours: Temp:  [98.1 F (36.7 C)-98.2 F (36.8 C)] 98.2 F (36.8 C) (06/19 0603) Pulse Rate:  [50-78] 78  (06/19 0603) Resp:  [17-18] 17  (06/19 0603) BP: (161-205)/(43-70) 196/65 mmHg (06/19 0603) SpO2:  [95 %-98 %] 95 % (06/19 0603) FiO2 (%):  [2 %] 2 % (06/18 1335) Weight change:  Last BM Date: 08/09/11  CBG (last 3)   Basename 08/10/11 2052 08/10/11 1723 08/10/11 1256  GLUCAP 178* 115* 166*    Intake/Output from previous day: 06/18 0701 - 06/19 0700 In: 1003 [P.O.:720; I.V.:233; IV Piggyback:50] Out: -  Intake/Output this shift:    General appearance: fatigued Eyes: no scleral icterus Throat: oropharynx moist without erythema Resp: clear to auscultation bilaterally Cardio: regular rate and rhythm, S1, S2 normal, no murmur, click, rub or gallop GI: soft, non-tender; bowel sounds normal; no masses,  no organomegaly Extremities: no clubbing, cyanosis or edema   Lab Results:  [facility] 08/11/11 0334 08/10/11 0343  NA 139 140  K 3.8 3.6  CL 101 99  CO2 27 30  GLUCOSE 148* 149*  BUN 68* 87*  CREATININE 2.55* 3.03*  CALCIUM 9.4 9.3  MG -- 1.5  PHOS -- --    Basename 08/11/11 0334 08/10/11 0343  AST 18 16  ALT 12 12  ALKPHOS 130* 130*  BILITOT 0.5 0.5  PROT 6.2 6.1  ALBUMIN 2.7* 2.6*    Basename 08/11/11 0334 08/10/11 0343  WBC 8.8 9.2  NEUTROABS -- --  HGB 10.9* 10.8*  HCT 34.1* 33.9*  MCV 97.7 98.0  PLT 256 270   No results found for this basename: INR, PROTIME   No results found for this basename: CKTOTAL:3,CKMB:3,CKMBINDEX:3,TROPONINI:3 in the last 72 hours No results found for this  basename: TSH,T4TOTAL,FREET3,T3FREE,THYROIDAB in the last 72 hours No results found for this basename: VITAMINB12:2,FOLATE:2,FERRITIN:2,TIBC:2,IRON:2,RETICCTPCT:2 in the last 72 hours  Studies/Results: No results found.   Medications: Scheduled:   . amLODipine  5 mg Oral Daily  . antiseptic oral rinse  15 mL Mouth Rinse BID  . aspirin  81 mg Oral Daily  . calcitRIOL  0.25 mcg Oral Daily  . carvedilol  6.25 mg Oral BID WC  . cefTRIAXone (ROCEPHIN)  IV  1 g Intravenous Q24H  . cloNIDine  0.1 mg Oral BID  . dextromethorphan-guaiFENesin  1 tablet Oral Q12H  . docusate sodium  100 mg Oral BID  . enoxaparin  30 mg Subcutaneous Q24H  . escitalopram  20 mg Oral QHS  . folic acid-pyridoxine-cyancobalamin  1 tablet Oral Daily  . gabapentin  100 mg Oral TID  . hydrALAZINE  50 mg Oral Q8H  . insulin glargine  20 Units Subcutaneous QHS  . isosorbide dinitrate  20 mg Oral BID  . levothyroxine  100 mcg Oral Daily  . magnesium sulfate 1 - 4 g bolus IVPB  2 g Intravenous Once  . nystatin cream  1 application Topical BID  . pantoprazole  40 mg Oral Q1200  . potassium chloride  40 mEq Oral Once  . senna  1 tablet Oral Daily  . DISCONTD: ciprofloxacin  200 mg Intravenous Q24H  .  DISCONTD: hydrALAZINE  25 mg Oral Q8H   Continuous:   . sodium chloride 100 mL/hr at 08/10/11 1138    Assessment/Plan: 1. *Dehydration- slowly improving. Lasix held. Decrease IV fluids due to increased shortness of breath and HTN. Active Problems:  2. Hypokalemia- improved. 3. UTI (lower urinary tract infection)- E.coli resistant to Cipro. Continue Rocephin IV X 7days. 4. Acute on chronic renal failure- nearing end-stage, declines dialysis. Improved slightly with hydration consistent with prerenal component in setting of UTI and diuretic therapy. Monitor with continued hydration.  5. Hypertension- BP elevated despite increase in Hydralazine- will increase to 100mg  q8hours.  Unlikely to control in setting of renal  failure. 6. DM (diabetes mellitus), type 2 with renal complications- adequate control on Lantus/SSI.  7. Metabolic Encephalopathy- due to ESRD/uremia. 8. Disposition- discussed with her daughter.  Given PT/OT recommendations and inability to perform ADLs without assistance she will need SNF- will start with rehab goals to attempt eventual return to ALF but anticipate that she may plateau quickly and transition back to Hospice care- may need LTC at [facility].  Plan for discharge to SNF tomorrow for PT/OT and IV antibiotics.    LOS: 5 days   Ligia Duguay,W DOUGLAS 08/11/2011, 7:52 AM

## 2011-08-12 LAB — CBC
HCT: 33.4 % — ABNORMAL LOW (ref 36.0–46.0)
MCHC: 32 g/dL (ref 30.0–36.0)
MCV: 97.9 fL (ref 78.0–100.0)
RDW: 15.4 % (ref 11.5–15.5)

## 2011-08-12 LAB — COMPREHENSIVE METABOLIC PANEL
ALT: 13 U/L (ref 0–35)
Albumin: 2.8 g/dL — ABNORMAL LOW (ref 3.5–5.2)
Alkaline Phosphatase: 128 U/L — ABNORMAL HIGH (ref 39–117)
Potassium: 3.7 mEq/L (ref 3.5–5.1)
Sodium: 140 mEq/L (ref 135–145)
Total Protein: 6.3 g/dL (ref 6.0–8.3)

## 2011-08-12 LAB — GLUCOSE, CAPILLARY
Glucose-Capillary: 112 mg/dL — ABNORMAL HIGH (ref 70–99)
Glucose-Capillary: 121 mg/dL — ABNORMAL HIGH (ref 70–99)

## 2011-08-12 MED ORDER — HYDRALAZINE HCL 100 MG PO TABS: 100.0000 mg | ORAL_TABLET | Freq: Three times a day (TID) | ORAL | Status: AC

## 2011-08-12 MED ORDER — INSULIN GLARGINE 100 UNIT/ML ~~LOC~~ SOLN: 20.0000 [IU] | Freq: Every day | SUBCUTANEOUS | Status: AC

## 2011-08-12 MED ORDER — DEXTROSE 5 % IV SOLN: 1.0000 g | INTRAVENOUS | Status: AC

## 2011-08-12 NOTE — Progress Notes (Signed)
Report given to Sunday Spillers RN at [facility].

## 2011-08-12 NOTE — Discharge Summary (Signed)
DISCHARGE SUMMARY  Carrie [name]  MR#: [id number]  DOB:09-08-1929  Date of Admission: 08/06/2011 Date of Discharge: 08/12/2011  Attending Physician:Annalaya Wile,W [name]  Patient's AOZ:HYQM,Carrie [name]  Consults:Treatment Team:  [facility]  Discharge Diagnoses: Principal Problem:  *Dehydration Active Problems:  Hypokalemia  UTI (lower urinary tract infection)  Acute on chronic renal failure  Hypertension  DM (diabetes mellitus), type 2 with renal complications  Metabolic encephalopathy  Past Medical History  Diagnosis Date  . Dementia   . Hypertension   . Thyroid disease   . CAD (coronary artery disease)   . Neuropathy   . Hyperlipidemia   . Arthritis   . Osteoporosis   . Retinopathy   . Diabetes mellitus   . Gout   . Hyperlipidemia   . Celiac disease     Discharge Medications: Medication List  As of 08/12/2011  7:39 AM   STOP taking these medications         furosemide 80 MG tablet         TAKE these medications         acetaminophen 325 MG tablet   Commonly known as: TYLENOL   Take 650 mg by mouth every 4 (four) hours as needed. For pain      amLODipine 5 MG tablet   Commonly known as: NORVASC   Take 5 mg by mouth daily.      aspirin 81 MG chewable tablet   Chew 81 mg by mouth daily.      calcitRIOL 0.25 MCG capsule   Commonly known as: ROCALTROL   Take 0.25 mcg by mouth daily.      carvedilol 6.25 MG tablet   Commonly known as: COREG   Take 6.25 mg by mouth 2 (two) times daily with a meal. At 8 am and 5 pm      cloNIDine 0.1 MG tablet   Commonly known as: CATAPRES   Take 0.1 mg by mouth 2 (two) times daily. At 9 am and 8 pm      colchicine 0.6 MG tablet   Take 0.6 mg by mouth every 8 (eight) hours as needed. For gout      dextromethorphan-guaiFENesin 30-600 MG per 12 hr tablet   Commonly known as: MUCINEX DM   Take 1 tablet by mouth every 12 (twelve) hours. Patient is given this prn.      dextrose 5 % SOLN 50 mL with cefTRIAXone  1 G SOLR 1 g   Inject 1 g into the vein daily.      docusate sodium 100 MG capsule   Commonly known as: COLACE   Take 100 mg by mouth 2 (two) times daily.      escitalopram 20 MG tablet   Commonly known as: LEXAPRO   Take 20 mg by mouth at bedtime.      FOLGARD RX 2.2-25-1 MG Tabs   Generic drug: Folic Acid-Vit B6-Vit B12   Take 1 tablet by mouth daily.      FREEZE IT 0.2-3.5 % Gel   Generic drug: Camphor-Menthol   Apply 1 application topically 3 (three) times daily as needed. For knee pain      gabapentin 100 MG capsule   Commonly known as: NEURONTIN   Take 100 mg by mouth 3 (three) times daily.      hydrALAZINE 100 MG tablet   Commonly known as: APRESOLINE   Take 1 tablet (100 mg total) by mouth every 8 (eight) hours.      insulin glargine  100 UNIT/ML injection   Commonly known as: LANTUS   Inject 20 Units into the skin at bedtime.      isosorbide dinitrate 20 MG tablet   Commonly known as: ISORDIL   Take 20 mg by mouth 2 (two) times daily.      levothyroxine 100 MCG tablet   Commonly known as: SYNTHROID, LEVOTHROID   Take 100 mcg by mouth daily.      multivitamins with iron Tabs   Take 1 tablet by mouth daily.      nitroGLYCERIN 0.4 MG SL tablet   Commonly known as: NITROSTAT   Place 0.4 mg under the tongue every 5 (five) minutes as needed. For 3 doses for chest pain.  If no relief, call doctor      nystatin cream   Commonly known as: MYCOSTATIN   Apply 1 application topically 2 (two) times daily. Patient has this medication applied to her stomach.      omeprazole 20 MG capsule   Commonly known as: PRILOSEC   Take 20 mg by mouth daily.      oxyCODONE 5 MG immediate release tablet   Commonly known as: Oxy IR/ROXICODONE   Take 5 mg by mouth every 6 (six) hours as needed. For moderate to severe pain      polyethylene glycol powder powder   Commonly known as: GLYCOLAX/MIRALAX   Take 17 g by mouth daily as needed. For constipation      prochlorperazine 5 MG  tablet   Commonly known as: COMPAZINE   Take 5 mg by mouth every 6 (six) hours as needed.      senna 8.6 MG Tabs   Commonly known as: SENOKOT   Take 1 tablet by mouth daily. Patient is given this medication for constipation.      traMADol 50 MG tablet   Commonly known as: ULTRAM   Take 50 mg by mouth every 6 (six) hours as needed. For moderate pain            Hospital Procedures: Dg Chest Port 1 View  08/06/2011  *RADIOLOGY REPORT*  Clinical Data: Fever.  PORTABLE CHEST - 1 VIEW  Comparison: 12/06/2010  Findings: There is mild chronic cardiomegaly.  Pulmonary vascularity is slightly prominent but there are no infiltrates or effusions and there is no pulmonary edema.  No acute osseous abnormality.  Severe degenerative changes of both shoulders, right greater than left.  IMPRESSION: Slight pulmonary vascular prominence and cardiomegaly.  Original Report Authenticated By: Gwynn Burly, M.D.    History of Present Illness: (From Dr. Laurey Morale H&P): Carrie [name] is a pleasant 78 year old female with multiple severe medical problems. She is essentially at ESRD level and she has been followed by hospice for the last 3 months. She is not going to get hemodialysis. She reaffirms this today. She had worsened mental status and presented to the ER. There she is found to have ARF on CKD with worse bun and creatinine above baseline (BUN and creatinine were 122 and 4.8 respectively on admission). She is also found to have Uti. She cannot give a lot of history. She says she has not been able to walk for some time. She reports some right leg pain. She denies cp or sob. She will be admitted.    Hospital Course: The patient was admitted to a medical bed. She was hydrated for her acute on chronic renal failure secondary to dehydration in the setting of a recurrent urinary tract infection and diuretic therapy. Lasix  was held. She was placed initially on IV Cipro for urinary tract infection. Cultures ultimately  grew Escherichia coli resistant to Cipro and she was transitioned to IV Rocephin. She does have a penicillin allergy but has tolerated this fine. She will complete a seven-day course of Rocephin. With hydration, her renal parameters improved and she actually had less nausea and has felt better. BUN and creatinine on the day of discharge are 58 and 2.43 respectively. Her blood pressure has been labile despite escalation of her antihypertensive therapy. She's not had a significant edema or increase in shortness of breath. She has had persistent confusion at times likely related to her uremia. She reiterated her desire not to have dialysis and to maintain comfort. She has been enrolled in hospice care at her assisted living facility and intends to continue with these goals of care. She has become severely deconditioned. She was seen by physical therapy occupational therapy who recommended skilled nursing rehabilitation. She is being discharged to a skilled nursing rehabilitation facility for acute rehabilitation with the hope of returning to the assisted-living facility where her husband resides with hospice care. Should she fail to improve long-term care may become necessary.  Day of Discharge Exam BP 188/57  Pulse 65  Temp 97.8 F (36.6 C) (Oral)  Resp 18  Ht 5\' 8"  (1.727 m)  Wt 96 kg (211 lb 10.3 oz)  BMI 32.18 kg/m2  SpO2 92%  Physical Exam: General appearance: alert and Chronically ill, disoriented Eyes: no scleral icterus Throat: oropharynx moist without erythema Resp: clear to auscultation bilaterally Cardio: regular rate and rhythm, S1, S2 normal, no murmur, click, rub or gallop GI: soft, non-tender; bowel sounds normal; no masses,  no organomegaly Extremities: no clubbing, cyanosis; trace edema  Discharge Labs:  [facility] 08/12/11 0336 08/11/11 0334 08/10/11 0343  NA 140 139 --  K 3.7 3.8 --  CL 102 101 --  CO2 26 27 --  GLUCOSE 133* 148* --  BUN 58* 68* --  CREATININE 2.43* 2.55*  --  CALCIUM 9.8 9.4 --  MG -- -- 1.5  PHOS -- -- --    Basename 08/12/11 0336 08/11/11 0334  AST 19 18  ALT 13 12  ALKPHOS 128* 130*  BILITOT 0.6 0.5  PROT 6.3 6.2  ALBUMIN 2.8* 2.7*    Basename 08/12/11 0336 08/11/11 0334  WBC 9.0 8.8  NEUTROABS -- --  HGB 10.7* 10.9*  HCT 33.4* 34.1*  MCV 97.9 97.7  PLT 278 256   No results found for this basename: INR, PROTIME   No results found for this basename: CKTOTAL:3,CKMB:3,CKMBINDEX:3,TROPONINI:3 in the last 72 hours No results found for this basename: TSH,T4TOTAL,FREET3,T3FREE,THYROIDAB in the last 72 hours No results found for this basename: VITAMINB12:2,FOLATE:2,FERRITIN:2,TIBC:2,IRON:2,RETICCTPCT:2 in the last 72 hours  Discharge instructions: Discharge Orders    Future Orders Please Complete By Expires   Diet - low sodium heart healthy      Increase activity slowly      Discharge instructions      Comments:   Care will be provided a skilled nursing facility physician until she is discharged to an assisted-living. Should continue physical therapy and occupational therapy. Should continue palliative care and resume hospice care once she has reached full rehabilitation potential.      Disposition: To [facility] skilled nursing facility for acute rehabilitation  Follow-up Appts: Follow-up with Dr. Clelia Croft at [facility] within one week of discharge from skilled nursing facility.  Call for appointment.  Condition on Discharge: Improved, chronically  ill  Tests Needing Follow-up:None  CODE STATUS: DO NOT RESUSCITATE  Time with Discharge activities: 40 minutes  Signed: Mayu Ronk,W [name] 08/12/2011, 7:39 AM

## 2011-08-12 NOTE — Discharge Instructions (Signed)

## 2011-08-12 NOTE — Progress Notes (Signed)
[facility] has a bed available and will accept the patient around 1:00 today.Pt for D/C today . Plan transport via EMS. Pt and family are agreeable to plans.  Kayleen Memos. Leighton Ruff [phone]

## 2011-08-12 NOTE — Progress Notes (Signed)
CSW was informed the patient accepted a bed at [facility]. However, the facilities is checking to see if it has beds available. The patient has offers at others facilities. These facilities will be consulted if [name] is  Unable to provide a bed.   Kayleen Memos. Leighton Ruff [phone]

## 2011-08-12 NOTE — Progress Notes (Signed)
Room 1313 - Deannie Resetar [facility]  [facility] RN Visit  Related admission to [facility] diagnosis of chronic kidney disease.   Pt is DNR code with OOF DNR on shadow chart.    Pt lying in bed, sleeping soundly, appears comfortable. Discussed with staff RN-pt alert and without any complaints of pain or discomfort earlier this am prior to visit.   Follow up visit 6/19 pm - pt was sitting up, alert, eating lunch with complaints of nausea having resolved.    No family present.    Per staff RN-pt to be discharged to [facility] for rehab today.  Dtr thought she would be dc'd to [facility].   LMOM for dtr to call [facility] with any needs or questions.    Please call [facility] @ [phone] with any needs.  Thank you.  Joneen Boers, RN  [facility]  Hospice Liaison

## 2011-08-13 LAB — CULTURE, BLOOD (ROUTINE X 2)
Culture  Setup Time: 201306150212
Culture: NO GROWTH

## 2011-08-15 ENCOUNTER — Encounter (HOSPITAL_COMMUNITY): Payer: Self-pay | Admitting: Emergency Medicine

## 2011-08-15 ENCOUNTER — Emergency Department (HOSPITAL_COMMUNITY)
Admission: EM | Admit: 2011-08-15 | Discharge: 2011-08-15 | Disposition: A | Discharge status: Skilled Nursing Facility | Payer: Medicare Other | Attending: Emergency Medicine | Admitting: Emergency Medicine

## 2011-08-15 DIAGNOSIS — Z66 Do not resuscitate: Secondary | ICD-10-CM | POA: Insufficient documentation

## 2011-08-15 DIAGNOSIS — R1084 Generalized abdominal pain: Secondary | ICD-10-CM

## 2011-08-15 MED ORDER — MORPHINE SULFATE (CONCENTRATE) 20 MG/ML PO SOLN
2.0000 mg | ORAL | Status: DC | PRN
  Administered 2011-08-15 (×3): 2 mg via ORAL
  Filled 2011-08-15: qty 1

## 2011-08-15 MED ORDER — LORAZEPAM 2 MG/ML PO CONC: 0.6000 mg | ORAL | Status: AC | PRN

## 2011-08-15 MED ORDER — MORPHINE SULFATE (CONCENTRATE) 20 MG/ML PO SOLN: 5.0000 mg | ORAL | Status: AC | PRN

## 2011-08-15 MED ORDER — LORAZEPAM 2 MG/ML PO CONC
0.5000 mg | ORAL | Status: DC | PRN
  Administered 2011-08-15: 0.5 mg via ORAL
  Filled 2011-08-15 (×2): qty 0.25

## 2011-08-15 NOTE — ED Notes (Signed)
[facility] arrived to transport pt back to [facility]. Report given.

## 2011-08-15 NOTE — ED Notes (Signed)
Pt increased moaning. Additional pain medication given.

## 2011-08-15 NOTE — ED Notes (Signed)
Received report from previous RN. Pt is resting. Denied being in pain but continues to moan intermittently. Dr. Effie Shy at bedside.

## 2011-08-15 NOTE — ED Provider Notes (Signed)
History     CSN: [id number]  Arrival date & time 08/15/11  1659   First MD Initiated Contact with Patient 08/15/11 1707      Chief Complaint  Patient presents with  . Pain    (Consider location/radiation/quality/duration/timing/severity/associated sxs/prior treatment) HPI Comments: Carrie [name] is a 78 y.o. Female who presents from an SNF with complaint of abdominal pain, not being adequately managed there. The patient is at end of life, but was reportedly placed in a SNF for IV treatment of a urinary tract infection. Her daughter, is with her and states that she is a DO NOT RESUSCITATE. The daughter, is interested in both comfort care, as well as minor interventions, to alleviate discomfort, but not in an effort to prolong life. The patient reportedly received a single Percocet  prior to transport. The patient is obtunded and cannot give history   Level V Caveat-Altered Mental Status  The history is provided by the patient.    Past Medical History  Diagnosis Date  . Dementia   . Hypertension   . Thyroid disease   . CAD (coronary artery disease)   . Neuropathy   . Hyperlipidemia   . Arthritis   . Osteoporosis   . Retinopathy   . Diabetes mellitus   . Gout   . Hyperlipidemia   . Celiac disease     History reviewed. No pertinent past surgical history.  History reviewed. No pertinent family history.  History  Substance Use Topics  . Smoking status: Never Smoker   . Smokeless tobacco: Never Used  . Alcohol Use: No    OB History    Grav Para Term Preterm Abortions TAB SAB Ect Mult Living                  Review of Systems  Unable to perform ROS   Allergies  Codeine; Hydrocodone; Penicillins; and Sulfa antibiotics  Home Medications   Current Outpatient Rx  Name Route Sig Dispense Refill  . ACETAMINOPHEN 325 MG PO TABS Oral Take 650 mg by mouth every 4 (four) hours as needed. For pain    . AMLODIPINE BESYLATE 5 MG PO TABS Oral Take 5 mg by mouth  daily.      . ASPIRIN 81 MG PO CHEW Oral Chew 81 mg by mouth daily.    Marland Kitchen CALCITRIOL 0.25 MCG PO CAPS Oral Take 0.25 mcg by mouth daily.      Marland Kitchen CAMPHOR-MENTHOL 0.2-3.5 % EX GEL Apply externally Apply 1 application topically 3 (three) times daily as needed. For knee pain    . CARVEDILOL 6.25 MG PO TABS Oral Take 6.25 mg by mouth 2 (two) times daily with a meal. At 8 am and 5 pm    . CLONIDINE HCL 0.1 MG PO TABS Oral Take 0.1 mg by mouth 2 (two) times daily. At 9 am and 8 pm    . COLCHICINE 0.6 MG PO TABS Oral Take 0.6 mg by mouth every 8 (eight) hours as needed. For gout    . CEFTRIAXONE  1 GM IVPB MIXTURE Intravenous Inject 1 g into the vein daily.    Marland Kitchen DOCUSATE SODIUM 100 MG PO CAPS Oral Take 100 mg by mouth 2 (two) times daily.      Marland Kitchen ESCITALOPRAM OXALATE 20 MG PO TABS Oral Take 20 mg by mouth at bedtime.    Marland Kitchen FOLIC ACID-VIT B6-VIT B12 2.2-25-1 MG PO TABS Oral Take 1 tablet by mouth daily.    Marland Kitchen GABAPENTIN 100 MG  PO CAPS Oral Take 100 mg by mouth 3 (three) times daily.      Marland Kitchen HYDRALAZINE HCL 100 MG PO TABS Oral Take 1 tablet (100 mg total) by mouth every 8 (eight) hours.    . INSULIN GLARGINE 100 UNIT/ML [place] SOLN Subcutaneous Inject 20 Units into the skin at bedtime. 10 mL   . ISOSORBIDE DINITRATE 20 MG PO TABS Oral Take 20 mg by mouth 2 (two) times daily.      Marland Kitchen LEVOTHYROXINE SODIUM 100 MCG PO TABS Oral Take 100 mcg by mouth daily.    . TAB-A-VITE/IRON PO TABS Oral Take 1 tablet by mouth daily.    Marland Kitchen NITROGLYCERIN 0.4 MG SL SUBL Sublingual Place 0.4 mg under the tongue every 5 (five) minutes as needed. For 3 doses for chest pain.  If no relief, call doctor    . NYSTATIN 100000 UNIT/GM EX CREA Topical Apply 1 application topically 2 (two) times daily. Patient has this medication applied to her stomach.    . OMEPRAZOLE 20 MG PO CPDR Oral Take 20 mg by mouth daily.      Marland Kitchen POLYETHYLENE GLYCOL 3350 PO POWD Oral Take 17 g by mouth daily as needed. For constipation    . PROCHLORPERAZINE MALEATE 5 MG PO  TABS Oral Take 5 mg by mouth every 6 (six) hours as needed.    Marland Kitchen LORAZEPAM 2 MG/ML PO CONC Oral Take 0.3 mLs (0.6 mg total) by mouth every 2 (two) hours as needed. For anxiety and sedation 30 mL 0  . MORPHINE SULFATE (CONCENTRATE) 20 MG/ML PO SOLN Oral Take 0.25 mLs (5 mg total) by mouth every hour as needed for pain. 30 mL 0    BP 158/52  Pulse 61  Temp 98.9 F (37.2 C) (Rectal)  Resp 22  SpO2 96%  Physical Exam  Nursing note and vitals reviewed. Constitutional: She is oriented to person, place, and time. She appears well-developed.  HENT:  Head: Normocephalic and atraumatic.  Eyes: Conjunctivae and EOM are normal. Pupils are equal, round, and reactive to light.  Neck: Normal range of motion and phonation normal. Neck supple.  Cardiovascular: Normal rate, regular rhythm and intact distal pulses.   Pulmonary/Chest: She exhibits no tenderness.       Course respiration  Abdominal: Soft. She exhibits no distension. There is no tenderness. There is no guarding.  Musculoskeletal: Normal range of motion.  Neurological: She is alert and oriented to person, place, and time. She has normal strength. She exhibits normal muscle tone.  Skin: Skin is warm and dry.  Psychiatric: She has a normal mood and affect. Her behavior is normal. Judgment and thought content normal.    ED Course  Procedures (including critical care time)  Labs Reviewed - No data to display No results found. Case was discussed with Dr. Evlyn Kanner. He is comfortable with continuing end-of-life care at the SNF.  1. Abdominal pain, generalized       MDM  Terminal-care patient at End-of-life. She can be safely managed in her current living setting at the SNF   Plan: Home Medications- Roxanol, Ativan concentrates; Home Treatments- rest; Recommended follow up- PCP        Flint Melter, MD 08/16/11 0025

## 2011-08-15 NOTE — ED Notes (Addendum)
Called [facility] Sofie Rower, [place]. She states that they have limited pain med orders and no sedation orders for the pt and asked if Dr. Effie Shy could write a prescription for the meds he has recommended so they can keep her comfortable throughout the night until Dr. Evlyn Kanner can see her in the morning.

## 2011-08-15 NOTE — ED Notes (Signed)
[facility] called for transport.  

## 2011-08-15 NOTE — ED Notes (Signed)
Flushed IV with 10ml NS. Flushed well. NSL maintained.

## 2011-08-15 NOTE — ED Notes (Signed)
BMW:UX32<GM> Expected date:08/15/11<BR> Expected time: 4:53 PM<BR> Means of arrival:Ambulance<BR> Comments:<BR> M31. 81 f. DLOC; from facility. Hospice, ESRD. DNR but not valid. 10-15 mins

## 2011-08-15 NOTE — ED Notes (Signed)
Talked to Dr. Effie Shy.  Pt DNR.  Awaiting DNR order

## 2011-08-15 NOTE — ED Notes (Signed)
Spoke with Dr. Effie Shy and he has agreed to write the prescriptions. Verified with [facility], that they have the medications available.

## 2011-08-15 NOTE — Discharge Instructions (Signed)
She needs to begin taking morphine for pain, and Ativan for sedation. Call, Dr. Evlyn Kanner to initiate the orders. Please fill out the MOST form with your Dr.  Abdominal Pain Abdominal pain can be caused by many things. Your caregiver decides the seriousness of your pain by an examination and possibly blood tests and X-rays. Many cases can be observed and treated at home. Most abdominal pain is not caused by a disease and will probably improve without treatment. However, in many cases, more time must pass before a clear cause of the pain can be found. Before that point, it may not be known if you need more testing, or if hospitalization or surgery is needed. HOME CARE INSTRUCTIONS   Do not take laxatives unless directed by your caregiver.   Take pain medicine only as directed by your caregiver.   Only take over-the-counter or prescription medicines for pain, discomfort, or fever as directed by your caregiver.   Try a clear liquid diet (broth, tea, or water) for as long as directed by your caregiver. Slowly move to a bland diet as tolerated.  SEEK IMMEDIATE MEDICAL CARE IF:   The pain does not go away.   You have a fever.   You keep throwing up (vomiting).   The pain is felt only in portions of the abdomen. Pain in the right side could possibly be appendicitis. In an adult, pain in the left lower portion of the abdomen could be colitis or diverticulitis.   You pass bloody or black tarry stools.  MAKE SURE YOU:   Understand these instructions.   Will watch your condition.   Will get help right away if you are not doing well or get worse.  Document Released: 11/18/2004 Document Revised: 01/28/2011 Document Reviewed: 09/27/2007 [facility] Patient Information 2012 [place], [place].Abdominal Pain Abdominal pain can be caused by many things. Your caregiver decides the seriousness of your pain by an examination and possibly blood tests and X-rays. Many cases can be observed and treated at home.  Most abdominal pain is not caused by a disease and will probably improve without treatment. However, in many cases, more time must pass before a clear cause of the pain can be found. Before that point, it may not be known if you need more testing, or if hospitalization or surgery is needed. HOME CARE INSTRUCTIONS   Do not take laxatives unless directed by your caregiver.   Take pain medicine only as directed by your caregiver.   Only take over-the-counter or prescription medicines for pain, discomfort, or fever as directed by your caregiver.   Try a clear liquid diet (broth, tea, or water) for as long as directed by your caregiver. Slowly move to a bland diet as tolerated.  SEEK IMMEDIATE MEDICAL CARE IF:   The pain does not go away.   You have a fever.   You keep throwing up (vomiting).   The pain is felt only in portions of the abdomen. Pain in the right side could possibly be appendicitis. In an adult, pain in the left lower portion of the abdomen could be colitis or diverticulitis.   You pass bloody or black tarry stools.  MAKE SURE YOU:   Understand these instructions.   Will watch your condition.   Will get help right away if you are not doing well or get worse.  Document Released: 11/18/2004 Document Revised: 01/28/2011 Document Reviewed: 09/27/2007 [facility] Patient Information 2012 [place], [place].

## 2011-08-15 NOTE — ED Notes (Signed)
Pt from [facility].  Pt previously in hospice, but had to have hospice revoked to get into nursing home.  Family called ems for pain management.  Family states pt has been moaning more today.  DNR form not valid b/c not signed.  Last percocet 3:45pm.  Pt has renal failure and chf.

## 2011-08-16 NOTE — Progress Notes (Signed)
Clinical Social Work Department BRIEF PSYCHOSOCIAL ASSESSMENT 08/16/2011  Patient:  Carrie [name], Carrie [name]     Account Number:  [id number]     Admit date:  08/06/2011  Clinical Social Worker:  Tommi Emery, CLINICAL SOCIAL WORKER  Date/Time:  08/08/2011 03:37 PM  Referred by:  Physician  Date Referred:  08/08/2011 Referred for  SNF Placement   Other Referral:   Interview type:  Patient Other interview type:    PSYCHOSOCIAL DATA Living Status:  FACILITY Admitted from facility:  [facility] Level of care:  Skilled Nursing Facility Primary support name:  Arlene Primary support relationship to patient:  CHILD, ADULT Degree of support available:   good    CURRENT CONCERNS Current Concerns  Adjustment to Illness   Other Concerns:   the family expressed some concern as to her placement options.    SOCIAL WORK ASSESSMENT / PLAN will work with placing patient back to [facility] with hospice.   Assessment/plan status:  Psychosocial Support/Ongoing Assessment of Needs Other assessment/ plan:   Information/referral to community resources:    PATIENT'S/FAMILY'S RESPONSE TO PLAN OF CARE: the family and patient are okay with the plan.        Kayleen Memos. Leighton Ruff [phone]

## 2011-08-16 NOTE — Progress Notes (Signed)
Clinical Social Work Department CLINICAL SOCIAL WORK PLACEMENT NOTE 08/16/2011  Patient:  Carrie [name], Carrie [name]  Account Number:  [id number] Admit date:  08/06/2011  Clinical Social Worker:  Tommi Emery, CLINICAL SOCIAL WORKER  Date/time:  08/16/2011 03:32 PM  Clinical Social Work is seeking post-discharge placement for this patient at the following level of care:   SKILLED NURSING   (*CSW will update this form in Epic as items are completed)   08/10/2011  Patient/family provided with [facility] Department of Clinical Social Work's list of facilities offering this level of care within the geographic area requested by the patient (or if unable, by the patient's family).  08/10/2011  Patient/family informed of their freedom to choose among providers that offer the needed level of care, that participate in Medicare, Medicaid or managed care program needed by the patient, have an available bed and are willing to accept the patient.  08/10/2011  Patient/family informed of [facility]' ownership interest in [facility], as well as of the fact that they are under no obligation to receive care at this facility.  PASARR submitted to EDS on 08/10/2011 PASARR number received from EDS on 08/10/2011  FL2 transmitted to all facilities in geographic area requested by pt/family on  08/10/2011 FL2 transmitted to all facilities within larger geographic area on 08/10/2011  Patient informed that his/her managed care company has contracts with or will negotiate with  certain facilities, including the following:     Patient/family informed of bed offers received:  08/10/2011 Patient chooses bed at [facility] Physician recommends and patient chooses bed at  [facility]  Patient to be transferred to [facility] on  08/13/2011 Patient to be transferred to facility by [facility]  The following physician request were entered in  Epic:   Additional Comments: the patient was originally from the admitting facility. Plans were for the patient to go to hospice, however, patient will recieved hospice services from the facility.  Kayleen Memos. Leighton Ruff [phone]

## 2011-08-23 DEATH — deceased

## 2016-09-17 NOTE — ED Notes (Signed)
Per PCP states CBG 500-states she is nauseous
